# Patient Record
Sex: Female | Born: 1940 | Race: Black or African American | Hispanic: No | Marital: Single | Smoking: Former smoker
Health system: Southern US, Community
[De-identification: ages and names within clinical notes are randomized; demographics above are authoritative.]

## PROBLEM LIST (undated history)

## (undated) DIAGNOSIS — M1990 Unspecified osteoarthritis, unspecified site: Secondary | ICD-10-CM

## (undated) DIAGNOSIS — I10 Essential (primary) hypertension: Secondary | ICD-10-CM

## (undated) DIAGNOSIS — Z992 Dependence on renal dialysis: Secondary | ICD-10-CM

## (undated) DIAGNOSIS — E785 Hyperlipidemia, unspecified: Secondary | ICD-10-CM

## (undated) DIAGNOSIS — N189 Chronic kidney disease, unspecified: Secondary | ICD-10-CM

## (undated) DIAGNOSIS — E119 Type 2 diabetes mellitus without complications: Secondary | ICD-10-CM

## (undated) DIAGNOSIS — H547 Unspecified visual loss: Secondary | ICD-10-CM

## (undated) DIAGNOSIS — H409 Unspecified glaucoma: Secondary | ICD-10-CM

## (undated) HISTORY — DX: Unspecified osteoarthritis, unspecified site: M19.90

## (undated) HISTORY — DX: Dependence on renal dialysis: Z99.2

## (undated) HISTORY — DX: Unspecified glaucoma: H40.9

## (undated) HISTORY — PX: CHOLECYSTECTOMY: SHX55

## (undated) HISTORY — DX: Unspecified visual loss: H54.7

## (undated) HISTORY — DX: Essential (primary) hypertension: I10

## (undated) HISTORY — DX: Hyperlipidemia, unspecified: E78.5

## (undated) HISTORY — DX: Chronic kidney disease, unspecified: N18.9

---

## 2014-01-25 ENCOUNTER — Ambulatory Visit (INDEPENDENT_AMBULATORY_CARE_PROVIDER_SITE_OTHER): Payer: Medicare Other | Admitting: Podiatry

## 2014-01-25 ENCOUNTER — Encounter: Payer: Self-pay | Admitting: Podiatry

## 2014-01-25 VITALS — BP 97/63 | HR 65 | Resp 15 | Ht 67.0 in | Wt 141.0 lb

## 2014-01-25 DIAGNOSIS — E1151 Type 2 diabetes mellitus with diabetic peripheral angiopathy without gangrene: Secondary | ICD-10-CM

## 2014-01-25 DIAGNOSIS — E1140 Type 2 diabetes mellitus with diabetic neuropathy, unspecified: Secondary | ICD-10-CM

## 2014-01-25 DIAGNOSIS — E1149 Type 2 diabetes mellitus with other diabetic neurological complication: Secondary | ICD-10-CM

## 2014-01-25 DIAGNOSIS — B351 Tinea unguium: Secondary | ICD-10-CM

## 2014-01-25 NOTE — Progress Notes (Signed)
   Subjective:    Patient ID: Christina [name], female    DOB: 18-Dec-1940, 73 y.o.   MRN: [id number]  HPI Comments: N debridement L 10 toenails D and O long term C elongated, thick toenails A diabetes, blind, and difficulty cutting T [facility]     Review of Systems  All other systems reviewed and are negative.  end-stage renal disease Visual impairment     Objective:   Physical Exam  Orientated x3 presents with her daughter-in-law  Vascular: DP 1/4 right DP 0/4 left PT 1/4 right PT left 0/4  Neurological: Sensation to 10 g monofilament wire intact 2/5 right and 0/5 left Vibratory sensation nonreactive bilaterally Ankle reflexes reactive bilaterally  Dermatological The toenails are elongated, brittle, hypertrophic 6-10  Musculoskeletal: Unstable gait pattern HAV deformities bilaterally Hammertoe deformities 2-5 bilaterally      Assessment & Plan:   Assessment: Peripheral arterial disease bilaterally Gait disturbance Diabetic peripheral neuropathy Onychomycoses x10  Plan: Nails x10 are debrided without a bleeding  Reappoint at three-month intervals

## 2014-01-25 NOTE — Patient Instructions (Signed)
Diabetes and Foot Care Diabetes may cause you to have problems because of poor blood supply (circulation) to your feet and legs. This may cause the skin on your feet to become thinner, break easier, and heal more slowly. Your skin may become dry, and the skin may peel and crack. You may also have nerve damage in your legs and feet causing decreased feeling in them. You may not notice minor injuries to your feet that could lead to infections or more serious problems. Taking care of your feet is one of the most important things you can do for yourself.  HOME CARE INSTRUCTIONS  Wear shoes at all times, even in the house. Do not go barefoot. Bare feet are easily injured.  Check your feet daily for blisters, cuts, and redness. If you cannot see the bottom of your feet, use a mirror or ask someone for help.  Wash your feet with warm water (do not use hot water) and mild soap. Then pat your feet and the areas between your toes until they are completely dry. Do not soak your feet as this can dry your skin.  Apply a moisturizing lotion or petroleum jelly (that does not contain alcohol and is unscented) to the skin on your feet and to dry, brittle toenails. Do not apply lotion between your toes.  Trim your toenails straight across. Do not dig under them or around the cuticle. File the edges of your nails with an emery board or nail file.  Do not cut corns or calluses or try to remove them with medicine.  Wear clean socks or stockings every day. Make sure they are not too tight. Do not wear knee-high stockings since they may decrease blood flow to your legs.  Wear shoes that fit properly and have enough cushioning. To break in new shoes, wear them for just a few hours a day. This prevents you from injuring your feet. Always look in your shoes before you put them on to be sure there are no objects inside.  Do not cross your legs. This may decrease the blood flow to your feet.  If you find a minor scrape,  cut, or break in the skin on your feet, keep it and the skin around it clean and dry. These areas may be cleansed with mild soap and water. Do not cleanse the area with peroxide, alcohol, or iodine.  When you remove an adhesive bandage, be sure not to damage the skin around it.  If you have a wound, look at it several times a day to make sure it is healing.  Do not use heating pads or hot water bottles. They may burn your skin. If you have lost feeling in your feet or legs, you may not know it is happening until it is too late.  Make sure your health care provider performs a complete foot exam at least annually or more often if you have foot problems. Report any cuts, sores, or bruises to your health care provider immediately. SEEK MEDICAL CARE IF:   You have an injury that is not healing.  You have cuts or breaks in the skin.  You have an ingrown nail.  You notice redness on your legs or feet.  You feel burning or tingling in your legs or feet.  You have pain or cramps in your legs and feet.  Your legs or feet are numb.  Your feet always feel cold. SEEK IMMEDIATE MEDICAL CARE IF:   There is increasing redness,   swelling, or pain in or around a wound.  There is a red line that goes up your leg.  Pus is coming from a wound.  You develop a fever or as directed by your health care provider.  You notice a bad smell coming from an ulcer or wound. Document Released: 04/06/2000 Document Revised: 12/10/2012 Document Reviewed: 09/16/2012 Exit[employer] Patient Information 2015 [employer]. This information is not intended to replace advice given to you by your health care provider. Make sure you discuss any questions you have with your health care provider.  

## 2014-01-26 ENCOUNTER — Encounter: Payer: Self-pay | Admitting: Podiatry

## 2014-03-15 ENCOUNTER — Emergency Department (HOSPITAL_COMMUNITY): Payer: Medicare Other

## 2014-03-15 ENCOUNTER — Encounter (HOSPITAL_COMMUNITY): Payer: Self-pay | Admitting: Emergency Medicine

## 2014-03-15 ENCOUNTER — Emergency Department (HOSPITAL_COMMUNITY)
Admission: EM | Admit: 2014-03-15 | Discharge: 2014-03-15 | Disposition: A | Discharge status: Home / Self Care | Payer: Medicare Other | Attending: Emergency Medicine | Admitting: Emergency Medicine

## 2014-03-15 DIAGNOSIS — Z880 Allergy status to penicillin: Secondary | ICD-10-CM | POA: Diagnosis not present

## 2014-03-15 DIAGNOSIS — E119 Type 2 diabetes mellitus without complications: Secondary | ICD-10-CM | POA: Insufficient documentation

## 2014-03-15 DIAGNOSIS — M150 Primary generalized (osteo)arthritis: Secondary | ICD-10-CM | POA: Diagnosis not present

## 2014-03-15 DIAGNOSIS — M25562 Pain in left knee: Secondary | ICD-10-CM

## 2014-03-15 DIAGNOSIS — Z7982 Long term (current) use of aspirin: Secondary | ICD-10-CM | POA: Diagnosis not present

## 2014-03-15 DIAGNOSIS — M79605 Pain in left leg: Secondary | ICD-10-CM | POA: Diagnosis present

## 2014-03-15 DIAGNOSIS — Z791 Long term (current) use of non-steroidal anti-inflammatories (NSAID): Secondary | ICD-10-CM | POA: Insufficient documentation

## 2014-03-15 DIAGNOSIS — N186 End stage renal disease: Secondary | ICD-10-CM

## 2014-03-15 DIAGNOSIS — H540 Blindness, both eyes: Secondary | ICD-10-CM | POA: Diagnosis not present

## 2014-03-15 DIAGNOSIS — Z992 Dependence on renal dialysis: Secondary | ICD-10-CM | POA: Insufficient documentation

## 2014-03-15 DIAGNOSIS — Z794 Long term (current) use of insulin: Secondary | ICD-10-CM | POA: Insufficient documentation

## 2014-03-15 DIAGNOSIS — E785 Hyperlipidemia, unspecified: Secondary | ICD-10-CM | POA: Insufficient documentation

## 2014-03-15 DIAGNOSIS — R52 Pain, unspecified: Secondary | ICD-10-CM

## 2014-03-15 DIAGNOSIS — I120 Hypertensive chronic kidney disease with stage 5 chronic kidney disease or end stage renal disease: Secondary | ICD-10-CM | POA: Diagnosis not present

## 2014-03-15 DIAGNOSIS — M159 Polyosteoarthritis, unspecified: Secondary | ICD-10-CM

## 2014-03-15 DIAGNOSIS — Z79899 Other long term (current) drug therapy: Secondary | ICD-10-CM | POA: Insufficient documentation

## 2014-03-15 DIAGNOSIS — M25561 Pain in right knee: Secondary | ICD-10-CM

## 2014-03-15 DIAGNOSIS — M25572 Pain in left ankle and joints of left foot: Secondary | ICD-10-CM

## 2014-03-15 DIAGNOSIS — H409 Unspecified glaucoma: Secondary | ICD-10-CM | POA: Diagnosis not present

## 2014-03-15 LAB — COMPREHENSIVE METABOLIC PANEL
ALK PHOS: 110 U/L (ref 39–117)
ALT: 18 U/L (ref 0–35)
AST: 23 U/L (ref 0–37)
Albumin: 3.8 g/dL (ref 3.5–5.2)
Anion gap: 20 — ABNORMAL HIGH (ref 5–15)
BILIRUBIN TOTAL: 0.3 mg/dL (ref 0.3–1.2)
BUN: 55 mg/dL — ABNORMAL HIGH (ref 6–23)
CALCIUM: 9.9 mg/dL (ref 8.4–10.5)
CHLORIDE: 95 meq/L — AB (ref 96–112)
CO2: 22 meq/L (ref 19–32)
Creatinine, Ser: 8.66 mg/dL — ABNORMAL HIGH (ref 0.50–1.10)
GFR calc non Af Amer: 4 mL/min — ABNORMAL LOW (ref >90)
GFR, EST AFRICAN AMERICAN: 5 mL/min — AB (ref >90)
GLUCOSE: 81 mg/dL (ref 70–99)
POTASSIUM: 4.5 meq/L (ref 3.7–5.3)
Sodium: 137 mEq/L (ref 137–147)
Total Protein: 8.3 g/dL (ref 6.0–8.3)

## 2014-03-15 LAB — CBC
HCT: 33.5 % — ABNORMAL LOW (ref 36.0–46.0)
HEMOGLOBIN: 11.5 g/dL — AB (ref 12.0–15.0)
MCH: 33 pg (ref 26.0–34.0)
MCHC: 34.3 g/dL (ref 30.0–36.0)
MCV: 96.3 fL (ref 78.0–100.0)
PLATELETS: 151 10*3/uL (ref 150–400)
RBC: 3.48 MIL/uL — AB (ref 3.87–5.11)
RDW: 13.6 % (ref 11.5–15.5)
WBC: 6.7 10*3/uL (ref 4.0–10.5)

## 2014-03-15 MED ORDER — SODIUM CHLORIDE 0.9 % IV SOLN: INTRAVENOUS | Status: DC

## 2014-03-15 MED ORDER — HYDROCODONE-ACETAMINOPHEN 5-325 MG PO TABS: 1.0000 | ORAL_TABLET | Freq: Four times a day (QID) | ORAL | Status: DC | PRN

## 2014-03-15 MED ORDER — HYDROCODONE-ACETAMINOPHEN 5-325 MG PO TABS
1.0000 | ORAL_TABLET | Freq: Once | ORAL | Status: AC
  Administered 2014-03-15: 1 via ORAL
  Filled 2014-03-15: qty 1

## 2014-03-15 NOTE — ED Provider Notes (Addendum)
CSN: [id number]     Arrival date & time 03/15/14  [id number] History   First MD Initiated Contact with Patient 03/15/14 1854     Chief Complaint  Patient presents with  . Leg Pain     (Consider location/radiation/quality/duration/timing/severity/associated sxs/prior Treatment) Patient is a 73 y.o. female presenting with leg pain. The history is provided by the patient and the EMS personnel.  Leg Pain Associated symptoms: no back pain, no fever and no neck pain   pt with hx dm, esrd on hd, c/o bilateral knee pain and left ankle pain in the past week. States hx same, but worse in past week. States now when tries to stand or walk w walker, that her knees give way, and wont hold her. Denies injury from fall. States at home gets around minimal distances w walker or is in wheelchair. Pt states goes to dialyses T/Th/Sat, went Saturday.  This week her HD schedule was to be changed to M/W/Sat, but she did not go today due to pain in knees and ankle. Denies sob. No chest pain or discomfort. Denies headache. No neck or back pain. No leg swelling. No fever or chills. bil knee and ankle pain described as constant, dull, moderate/severe, worse w standing or trying to walk.     Past Medical History  Diagnosis Date  . Hyperlipidemia   . Hypertension   . Chronic kidney disease   . Glaucoma   . Arthritis   . Dialysis patient   . Diabetes mellitus without complication   . Blind    Past Surgical History  Procedure Laterality Date  . Cholecystectomy     History reviewed. No pertinent family history. History  Substance Use Topics  . Smoking status: Former Games developermoker  . Smokeless tobacco: Not on file  . Alcohol Use: Not on file   OB History    No data available     Review of Systems  Constitutional: Negative for fever and chills.  HENT: Negative for sore throat.   Eyes:       Pt states blind, at baseline.   Respiratory: Negative for cough and shortness of breath.   Cardiovascular: Negative for chest  pain and leg swelling.  Gastrointestinal: Negative for vomiting, abdominal pain and diarrhea.  Genitourinary: Negative for flank pain.  Musculoskeletal: Negative for back pain and neck pain.  Skin: Negative for rash and wound.  Neurological: Negative for weakness, numbness and headaches.  Hematological: Does not bruise/bleed easily.  Psychiatric/Behavioral: Negative for confusion.      Allergies  Cheese; Penicillins; and Tomato  Home Medications   Prior to Admission medications   Medication Sig Start Date End Date Taking? Authorizing Provider  alendronate (FOSAMAX) 70 MG tablet Take 70 mg by mouth once a week. Take with a full glass of water on an empty stomach.    Historical Provider, MD  amLODipine (NORVASC) 5 MG tablet Take 5 mg by mouth daily.    Historical Provider, MD  aspirin 81 MG tablet Take 81 mg by mouth daily.    Historical Provider, MD  atorvastatin (LIPITOR) 20 MG tablet Take 20 mg by mouth daily.    Historical Provider, MD  brimonidine (ALPHAGAN) 0.15 % ophthalmic solution 1 drop 3 (three) times daily.    Historical Provider, MD  Dorzolamide HCl-Timolol Mal PF 22.3-6.8 MG/ML SOLN Apply to eye.    Historical Provider, MD  Ibuprofen (ADVIL) 200 MG CAPS Take by mouth.    Historical Provider, MD  insulin NPH-regular Human (NOVOLIN 70/30) (70-30)  100 UNIT/ML injection Inject into the skin.    Historical Provider, MD  latanoprost (XALATAN) 0.005 % ophthalmic solution 1 drop at bedtime.    Historical Provider, MD  lisinopril (PRINIVIL,ZESTRIL) 20 MG tablet Take 20 mg by mouth daily.    Historical Provider, MD  meloxicam (MOBIC) 15 MG tablet Take 15 mg by mouth daily.    Historical Provider, MD  metoprolol succinate (TOPROL-XL) 100 MG 24 hr tablet Take 100 mg by mouth daily. Take with or immediately following a meal.    Historical Provider, MD  MULTIPLE VITAMIN PO Take by mouth.    Historical Provider, MD  pantoprazole (PROTONIX) 40 MG tablet Take 40 mg by mouth daily.     Historical Provider, MD  traMADol (ULTRAM) 50 MG tablet Take by mouth every 6 (six) hours as needed.    Historical Provider, MD   BP 150/70 mmHg  Pulse 70  Temp(Src) 98 F (36.7 C) (Oral)  Resp 20  Ht 5\' 7"  (1.702 m)  Wt 154 lb 5.2 oz (70 kg)  BMI 24.16 kg/m2  SpO2 100% Physical Exam  Constitutional: She is oriented to person, place, and time. She appears well-developed and well-nourished. No distress.  HENT:  Head: Atraumatic.  Mouth/Throat: Oropharynx is clear and moist.  Eyes: Conjunctivae are normal. No scleral icterus.  Neck: Neck supple. No tracheal deviation present. No thyromegaly present.  No bruit  Cardiovascular: Normal rate, regular rhythm, normal heart sounds and intact distal pulses.   Pulmonary/Chest: Effort normal and breath sounds normal. No respiratory distress.  Abdominal: Soft. Normal appearance and bowel sounds are normal. She exhibits no distension. There is no tenderness.  Genitourinary:  No cva tenderness  Musculoskeletal: She exhibits no edema.  CTLS spine, non tender, aligned, no step off. Tenderness bil knees and left ankle. Knees and ankle grossly stable. Distal pulses palp bil. No effusion or increased warmth at above joints. Good rom bil ext without pain.  No other focal bony tenderness noted. No swelling.   Neurological: She is alert and oriented to person, place, and time.  Motor intact bil. stre 5/5. sens grossly intact.   Skin: Skin is warm and dry. No rash noted. She is not diaphoretic.  Psychiatric: She has a normal mood and affect.  Nursing note and vitals reviewed.   ED Course  Procedures (including critical care time) Labs Review  Results for orders placed or performed during the hospital encounter of 03/15/14  CBC  Result Value Ref Range   WBC 6.7 4.0 - 10.5 K/uL   RBC 3.48 (L) 3.87 - 5.11 MIL/uL   Hemoglobin 11.5 (L) 12.0 - 15.0 g/dL   HCT 16.1 (L) 09.6 - 04.5 %   MCV 96.3 78.0 - 100.0 fL   MCH 33.0 26.0 - 34.0 pg   MCHC 34.3  30.0 - 36.0 g/dL   RDW 40.9 81.1 - 91.4 %   Platelets 151 150 - 400 K/uL  Comprehensive metabolic panel  Result Value Ref Range   Sodium 137 137 - 147 mEq/L   Potassium 4.5 3.7 - 5.3 mEq/L   Chloride 95 (L) 96 - 112 mEq/L   CO2 22 19 - 32 mEq/L   Glucose, Bld 81 70 - 99 mg/dL   BUN 55 (H) 6 - 23 mg/dL   Creatinine, Ser 7.82 (H) 0.50 - 1.10 mg/dL   Calcium 9.9 8.4 - 95.6 mg/dL   Total Protein 8.3 6.0 - 8.3 g/dL   Albumin 3.8 3.5 - 5.2 g/dL   AST 23  0 - 37 U/L   ALT 18 0 - 35 U/L   Alkaline Phosphatase 110 39 - 117 U/L   Total Bilirubin 0.3 0.3 - 1.2 mg/dL   GFR calc non Af Amer 4 (L) >90 mL/min   GFR calc Af Amer 5 (L) >90 mL/min   Anion gap 20 (H) 5 - 15   Dg Chest 2 View  03/15/2014   CLINICAL DATA:  Increasing leg pain and weakness. Left-sided chest pain.  EXAM: CHEST  2 VIEW  COMPARISON:  Chest radiograph 10/09/2013  FINDINGS: The heart size and mediastinal contours are within normal limits. Both lungs are clear. Negative for pneumothorax or pleural effusion. Typical degenerative changes of the thoracic spine.  IMPRESSION: No active cardiopulmonary disease.   Electronically Signed   By: Britta MccreedySusan  Turner M.D.   On: 03/15/2014 20:39   Dg Ankle Complete Left  03/15/2014   CLINICAL DATA:  Left ankle pain following fall 3 days previous  EXAM: LEFT ANKLE COMPLETE - 3+ VIEW  COMPARISON:  None.  FINDINGS: Mild tarsal degenerative changes are seen. Calcaneal spurs are noted. There are multiple well corticated bony densities adjacent to the distal tibia consistent with prior trauma. No other focal abnormality is seen.  IMPRESSION: Changes of prior trauma without acute abnormality.   Electronically Signed   By: Alcide CleverMark  Lukens M.D.   On: 03/15/2014 20:41   Dg Knee Complete 4 Views Left  03/15/2014   CLINICAL DATA:  Increasing left knee pain since a fall 2 days ago.  EXAM: LEFT KNEE - COMPLETE 4+ VIEW  COMPARISON:  09/08/2013  FINDINGS: There is no fracture or dislocation or joint effusion. There  is severe osteoarthritis of the medial compartment with small marginal osteophytes on the patella. There is chondrocalcinosis.  IMPRESSION: No acute abnormality. 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   Electronically Signed   By: Geanie CooleyJim  Maxwell M.D.   On: 03/15/2014 20:39   Dg Knee Complete 4 Views Right  03/15/2014   CLINICAL DATA:  Right knee pain following fall 2 days previous  EXAM: RIGHT KNEE - COMPLETE 4+ VIEW  COMPARISON:  09/08/2013  FINDINGS: Irregular flocculent calcifications are again noted posteriorly likely related to loose bodies. Vascular calcifications are seen. No joint effusion is noted. Severe medial joint space narrowing and osteophytic changes are noted. No acute fracture or dislocation is seen.  IMPRESSION: Degenerative changes and likely loose bodies without acute abnormality.   Electronically Signed   By: Alcide CleverMark  Lukens M.D.   On: 03/15/2014 20:43       EKG Interpretation   Date/Time:  Monday March 15 2014 18:57:59 EST Ventricular Rate:  73 PR Interval:  145 QRS Duration: 96 QT Interval:  410 QTC Calculation: 452 R Axis:   -8 Text Interpretation:  Sinus rhythm Ventricular premature complex No  previous tracing Confirmed by Denton LankSTEINL  MD, Caryn BeeKEVIN ([id number]) on 03/15/2014  7:17:16 PM      MDM   Iv ns. Labs. Imaging.  Reviewed nursing notes and prior charts for additional history.   Hr 142 noted on list of vitals, appears an error, checked on pt then, sr, no tachycardia, hr 70's.   vicodin 1 po.  Recheck pt eating and drinking well. Appears very comfortable. Talking in complete sentences. No sob.   Pt w no joint swelling, redness or increased warmth. No leg swelling. Afeb. Normal passive rom without pain.  Significant degen changes noted on xr.   Pt appears stable for d/c.  k  normal. cxr clear, pulse ox 98%.  As pt missed hd today, will discuss with her renal physician re possible hd tomorrow Estate manager/land agent paged.   Pt remains comfortable, no new  c/o, and pt requests d/c to home stating does not want to stay in hospital.  Family present who can assist pt as need.  Discussed pt with Dr Darrick Penna - he states pt well known to him - to have pt call dialyses center tomorrow morning to get HD tomorrow.       Suzi Roots, MD 03/15/14 [phone]

## 2014-03-15 NOTE — ED Notes (Signed)
Phlebotomy at bedside.

## 2014-03-15 NOTE — ED Notes (Signed)
Pt ready for discharge, waiting on nephrology.

## 2014-03-15 NOTE — Discharge Instructions (Signed)
It was our pleasure to provide your ER care today - we hope that you feel better.  Use great caution to avoid falling and injury - see fall precautions.  Use family assistance and/or walker when up.   You may take hydrocodone as need for pain. Do not take tylenol or acetaminophen containing medication when taking hydrocodone.  Follow up with primary care doctor in coming week.  Regarding your dialyses, we discussed your case with your on call dialyses doctor - he indicates for you to contact your dialyses center first thing tomorrow morning to discuss getting dialyzed tomorrow.  Return to ER if worse, new symptoms, fevers, trouble breathing, other concern.       Osteoarthritis Osteoarthritis is a disease that causes soreness and inflammation of a joint. It occurs when the cartilage at the affected joint wears down. Cartilage acts as a cushion, covering the ends of bones where they meet to form a joint. Osteoarthritis is the most common form of arthritis. It often occurs in older people. The joints affected most often by this condition include those in the:  Ends of the fingers.  Thumbs.  Neck.  Lower back.  Knees.  Hips. CAUSES  Over time, the cartilage that covers the ends of bones begins to wear away. This causes bone to rub on bone, producing pain and stiffness in the affected joints.  RISK FACTORS Certain factors can increase your chances of having osteoarthritis, including:  Older age.  Excessive body weight.  Overuse of joints.  Previous joint injury. SIGNS AND SYMPTOMS   Pain, swelling, and stiffness in the joint.  Over time, the joint may lose its normal shape.  Small deposits of bone (osteophytes) may grow on the edges of the joint.  Bits of bone or cartilage can break off and float inside the joint space. This may cause more pain and damage. DIAGNOSIS  Your health care provider will do a physical exam and ask about your symptoms. Various tests may be  ordered, such as:  X-rays of the affected joint.  An MRI scan.  Blood tests to rule out other types of arthritis.  Joint fluid tests. This involves using a needle to draw fluid from the joint and examining the fluid under a microscope. TREATMENT  Goals of treatment are to control pain and improve joint function. Treatment plans may include:  A prescribed exercise program that allows for rest and joint relief.  A weight control plan.  Pain relief techniques, such as:  Properly applied heat and cold.  Electric pulses delivered to nerve endings under the skin (transcutaneous electrical nerve stimulation [TENS]).  Massage.  Certain nutritional supplements.  Medicines to control pain, such as:  Acetaminophen.  Nonsteroidal anti-inflammatory drugs (NSAIDs), such as naproxen.  Narcotic or central-acting agents, such as tramadol.  Corticosteroids. These can be given orally or as an injection.  Surgery to reposition the bones and relieve pain (osteotomy) or to remove loose pieces of bone and cartilage. Joint replacement may be needed in advanced states of osteoarthritis. HOME CARE INSTRUCTIONS   Take medicines only as directed by your health care provider.  Maintain a healthy weight. Follow your health care provider's instructions for weight control. This may include dietary instructions.  Exercise as directed. Your health care provider can recommend specific types of exercise. These may include:  Strengthening exercises. These are done to strengthen the muscles that support joints affected by arthritis. They can be performed with weights or with exercise bands to add resistance.  Aerobic activities. These are exercises, such as brisk walking or low-impact aerobics, that get your heart pumping.  Range-of-motion activities. These keep your joints limber.  Balance and agility exercises. These help you maintain daily living skills.  Rest your affected joints as directed by  your health care provider.  Keep all follow-up visits as directed by your health care provider. SEEK MEDICAL CARE IF:   Your skin turns red.  You develop a rash in addition to your joint pain.  You have worsening joint pain.  You have a fever along with joint or muscle aches. SEEK IMMEDIATE MEDICAL CARE IF:  You have a significant loss of weight or appetite.  You have night sweats. FOR MORE INFORMATION   National Institute of Arthritis and Musculoskeletal and Skin Diseases: www.niams.http://www.myers.net/  [employer] on Aging: https://walker.com/  American College of Rheumatology: www.rheumatology.org Document Released: 04/09/2005 Document Revised: 08/24/2013 Document Reviewed: 12/15/2012 [facility] Patient Information 2015 [place], [place]. This information is not intended to replace advice given to you by your health care provider. Make sure you discuss any questions you have with your health care provider.     Knee Pain The knee is the complex joint between your thigh and your lower leg. It is made up of bones, tendons, ligaments, and cartilage. The bones that make up the knee are:  The femur in the thigh.  The tibia and fibula in the lower leg.  The patella or kneecap riding in the groove on the lower femur. CAUSES  Knee pain is a common complaint with many causes. A few of these causes are:  Injury, such as:  A ruptured ligament or tendon injury.  Torn cartilage.  Medical conditions, such as:  Gout  Arthritis  Infections  Overuse, over training, or overdoing a physical activity. Knee pain can be minor or severe. Knee pain can accompany debilitating injury. Minor knee problems often respond well to self-care measures or get well on their own. More serious injuries may need medical intervention or even surgery. SYMPTOMS The knee is complex. Symptoms of knee problems can vary widely. Some of the problems are:  Pain with movement and weight bearing.  Swelling and  tenderness.  Buckling of the knee.  Inability to straighten or extend your knee.  Your knee locks and you cannot straighten it.  Warmth and redness with pain and fever.  Deformity or dislocation of the kneecap. DIAGNOSIS  Determining what is wrong may be very straight forward such as when there is an injury. It can also be challenging because of the complexity of the knee. Tests to make a diagnosis may include:  Your caregiver taking a history and doing a physical exam.  Routine X-rays can be used to rule out other problems. X-rays will not reveal a cartilage tear. Some injuries of the knee can be diagnosed by:  Arthroscopy a surgical technique by which a small video camera is inserted through tiny incisions on the sides of the knee. This procedure is used to examine and repair internal knee joint problems. Tiny instruments can be used during arthroscopy to repair the torn knee cartilage (meniscus).  Arthrography is a radiology technique. A contrast liquid is directly injected into the knee joint. Internal structures of the knee joint then become visible on X-ray film.  An MRI scan is a non X-ray radiology procedure in which magnetic fields and a computer produce two- or three-dimensional images of the inside of the knee. Cartilage tears are often visible using an MRI scanner. MRI  scans have largely replaced arthrography in diagnosing cartilage tears of the knee.  Blood work.  Examination of the fluid that helps to lubricate the knee joint (synovial fluid). This is done by taking a sample out using a needle and a syringe. TREATMENT The treatment of knee problems depends on the cause. Some of these treatments are:  Depending on the injury, proper casting, splinting, surgery, or physical therapy care will be needed.  Give yourself adequate recovery time. Do not overuse your joints. If you begin to get sore during workout routines, back off. Slow down or do fewer repetitions.  For  repetitive activities such as cycling or running, maintain your strength and nutrition.  Alternate muscle groups. For example, if you are a weight lifter, work the upper body on one day and the lower body the next.  Either tight or weak muscles do not give the proper support for your knee. Tight or weak muscles do not absorb the stress placed on the knee joint. Keep the muscles surrounding the knee strong.  Take care of mechanical problems.  If you have flat feet, orthotics or special shoes may help. See your caregiver if you need help.  Arch supports, sometimes with wedges on the inner or outer aspect of the heel, can help. These can shift pressure away from the side of the knee most bothered by osteoarthritis.  A brace called an "unloader" brace also may be used to help ease the pressure on the most arthritic side of the knee.  If your caregiver has prescribed crutches, braces, wraps or ice, use as directed. The acronym for this is PRICE. This means protection, rest, ice, compression, and elevation.  Nonsteroidal anti-inflammatory drugs (NSAIDs), can help relieve pain. But if taken immediately after an injury, they may actually increase swelling. Take NSAIDs with food in your stomach. Stop them if you develop stomach problems. Do not take these if you have a history of ulcers, stomach pain, or bleeding from the bowel. Do not take without your caregiver's approval if you have problems with fluid retention, heart failure, or kidney problems.  For ongoing knee problems, physical therapy may be helpful.  Glucosamine and chondroitin are over-the-counter dietary supplements. Both may help relieve the pain of osteoarthritis in the knee. These medicines are different from the usual anti-inflammatory drugs. Glucosamine may decrease the rate of cartilage destruction.  Injections of a corticosteroid drug into your knee joint may help reduce the symptoms of an arthritis flare-up. They may provide pain  relief that lasts a few months. You may have to wait a few months between injections. The injections do have a small increased risk of infection, water retention, and elevated blood sugar levels.  Hyaluronic acid injected into damaged joints may ease pain and provide lubrication. These injections may work by reducing inflammation. A series of shots may give relief for as long as 6 months.  Topical painkillers. Applying certain ointments to your skin may help relieve the pain and stiffness of osteoarthritis. Ask your pharmacist for suggestions. Many over the-counter products are approved for temporary relief of arthritis pain.  In some countries, doctors often prescribe topical NSAIDs for relief of chronic conditions such as arthritis and tendinitis. A review of treatment with NSAID creams found that they worked as well as oral medications but without the serious side effects. PREVENTION  Maintain a healthy weight. Extra pounds put more strain on your joints.  Get strong, stay limber. Weak muscles are a common cause of knee injuries. Stretching  is important. Include flexibility exercises in your workouts.  Be smart about exercise. If you have osteoarthritis, chronic knee pain or recurring injuries, you may need to change the way you exercise. This does not mean you have to stop being active. If your knees ache after jogging or playing basketball, consider switching to swimming, water aerobics, or other low-impact activities, at least for a few days a week. Sometimes limiting high-impact activities will provide relief.  Make sure your shoes fit well. Choose footwear that is right for your sport.  Protect your knees. Use the proper gear for knee-sensitive activities. Use kneepads when playing volleyball or laying carpet. Buckle your seat belt every time you drive. Most shattered kneecaps occur in car accidents.  Rest when you are tired. SEEK MEDICAL CARE IF:  You have knee pain that is continual  and does not seem to be getting better.  SEEK IMMEDIATE MEDICAL CARE IF:  Your knee joint feels hot to the touch and you have a high fever. MAKE SURE YOU:   Understand these instructions.  Will watch your condition.  Will get help right away if you are not doing well or get worse. Document Released: 02/04/2007 Document Revised: 07/02/2011 Document Reviewed: 02/04/2007 [facility] Patient Information 2015 [place], [place]. This information is not intended to replace advice given to you by your health care provider. Make sure you discuss any questions you have with your health care provider.    Fall Prevention and Home Safety Falls cause injuries and can affect all age groups. It is possible to use preventive measures to significantly decrease the likelihood of falls. There are many simple measures which can make your home safer and prevent falls. OUTDOORS  Repair cracks and edges of walkways and driveways.  Remove high doorway thresholds.  Trim shrubbery on the main path into your home.  Have good outside lighting.  Clear walkways of tools, rocks, debris, and clutter.  Check that handrails are not broken and are securely fastened. Both sides of steps should have handrails.  Have leaves, snow, and ice cleared regularly.  Use sand or salt on walkways during winter months.  In the garage, clean up grease or oil spills. BATHROOM  Install night lights.  Install grab bars by the toilet and in the tub and shower.  Use non-skid mats or decals in the tub or shower.  Place a plastic non-slip stool in the shower to sit on, if needed.  Keep floors dry and clean up all water on the floor immediately.  Remove soap buildup in the tub or shower on a regular basis.  Secure bath mats with non-slip, double-sided rug tape.  Remove throw rugs and tripping hazards from the floors. BEDROOMS  Install night lights.  Make sure a bedside light is easy to reach.  Do not use oversized  bedding.  Keep a telephone by your bedside.  Have a firm chair with side arms to use for getting dressed.  Remove throw rugs and tripping hazards from the floor. KITCHEN  Keep handles on pots and pans turned toward the center of the stove. Use back burners when possible.  Clean up spills quickly and allow time for drying.  Avoid walking on wet floors.  Avoid hot utensils and knives.  Position shelves so they are not too high or low.  Place commonly used objects within easy reach.  If necessary, use a sturdy step stool with a grab bar when reaching.  Keep electrical cables out of the way.  Do  not use floor polish or wax that makes floors slippery. If you must use wax, use non-skid floor wax.  Remove throw rugs and tripping hazards from the floor. STAIRWAYS  Never leave objects on stairs.  Place handrails on both sides of stairways and use them. Fix any loose handrails. Make sure handrails on both sides of the stairways are as long as the stairs.  Check carpeting to make sure it is firmly attached along stairs. Make repairs to worn or loose carpet promptly.  Avoid placing throw rugs at the top or bottom of stairways, or properly secure the rug with carpet tape to prevent slippage. Get rid of throw rugs, if possible.  Have an electrician put in a light switch at the top and bottom of the stairs. OTHER FALL PREVENTION TIPS  Wear low-heel or rubber-soled shoes that are supportive and fit well. Wear closed toe shoes.  When using a stepladder, make sure it is fully opened and both spreaders are firmly locked. Do not climb a closed stepladder.  Add color or contrast paint or tape to grab bars and handrails in your home. Place contrasting color strips on first and last steps.  Learn and use mobility aids as needed. Install an electrical emergency response system.  Turn on lights to avoid dark areas. Replace light bulbs that burn out immediately. Get light switches that  glow.  Arrange furniture to create clear pathways. Keep furniture in the same place.  Firmly attach carpet with non-skid or double-sided tape.  Eliminate uneven floor surfaces.  Select a carpet pattern that does not visually hide the edge of steps.  Be aware of all pets. OTHER HOME SAFETY TIPS  Set the water temperature for 120 F (48.8 C).  Keep emergency numbers on or near the telephone.  Keep smoke detectors on every level of the home and near sleeping areas. Document Released: 03/30/2002 Document Revised: 10/09/2011 Document Reviewed: 06/29/2011 [facility] Patient Information 2015 [place], [place]. This information is not intended to replace advice given to you by your health care provider. Make sure you discuss any questions you have with your health care provider.    Dialysis Dialysis is a procedure that replaces some of the work healthy kidneys do. It is done when you lose about 85-90% of your kidney function. It may also be done earlier if your symptoms may be improved by dialysis. During dialysis, wastes, salt, and extra water are removed from the blood, and the levels of certain chemicals in the blood (such as potassium) are maintained. Dialysis is done in sessions. Dialysis sessions are continued until the kidneys get better. If the kidneys cannot get better, such as in end-stage kidney disease, dialysis is continued for life or until you receive a new kidney (kidney transplant). There are two types of dialysis: hemodialysis and peritoneal dialysis. WHAT IS HEMODIALYSIS?  Hemodialysis is a type of dialysis in which a machine called a dialyzer is used to filter the blood. Before beginning hemodialysis, you will have surgery to create a site where blood can be removed from the body and returned to the body (vascular access). There are three types of vascular accesses:  Arteriovenous fistula. To create this type of access, an artery is connected to a vein (usually in the arm). A  fistula takes 1-6 months to develop after surgery. If it develops properly, it usually lasts longer than the other types of vascular accesses. It is also less likely to become infected and cause blood clots.  Arteriovenous graft. To  create this type of access, an artery and a vein in the arm are connected with a tube. A graft may be used within 2-3 weeks of surgery.  A venous catheter. To create this type of access, a thin, flexible tube (catheter) is placed in a large vein in your neck, chest, or groin. A catheter may be used right away. It is usually used as a temporary access when dialysis needs to begin immediately. During hemodialysis, blood leaves the body through your access. It travels through a tube to the dialyzer, where it is filtered. The blood then returns to your body through another tube. Hemodialysis is usually performed by a health care provider at a hospital or dialysis center three times a week. Visits last about 3-4 hours. It may also be performed with the help of another person at home with training.  WHAT IS PERITONEAL DIALYSIS? Peritoneal dialysis is a type of dialysis in which the thin lining of the abdomen (peritoneum) is used as a filter. Before beginning peritoneal dialysis, you will have surgery to place a catheter in your abdomen. The catheter will be used to transfer a fluid called dialysate to and from your abdomen. At the start of a session, your abdomen is filled with dialysate. During the session, wastes, salt, and extra water in the blood pass through the peritoneum and into the dialysate. The dialysate is drained from the body at the end of the session. The process of filling and draining the dialysate is called an exchange. Exchanges are repeated until you have used up all the dialysate for the day. Peritoneal dialysis may be performed by you at home or at almost any other location. It is done every day. You may need up to five exchanges a day. The amount of time the  dialysate is in your body between exchanges is called a dwell. The dwell depends on the number of exchanges needed and the characteristics of the peritoneum. It usually varies from 1.5-3 hours. You may go about your day normally between exchanges. Alternately, the exchanges may be done at night while you sleep, using a machine called a cycler. WHICH TYPE OF DIALYSIS SHOULD I CHOOSE?  Both hemodialysis and peritoneal dialysis have advantages and disadvantages. Talk to your health care provider about which type of dialysis would be best for you. Your lifestyle and preferences should be considered along with your medical condition. In some cases, only one type of dialysis may be an option.  Advantages of hemodialysis  It is done less often than peritoneal dialysis.  Someone else can do the dialysis for you.  If you go to a dialysis center, your health care provider will be able to recognize any problems right away.  If you go to a dialysis center, you can interact with others who are having dialysis. This can provide you with emotional support. Disadvantages of hemodialysis  Hemodialysis may cause cramps and low blood pressure. It may leave you feeling tired on the days you have the treatment.  If you go to a dialysis center, you will need to make weekly appointments and work around the center's schedule.  You will need to take extra care when traveling. If you go to a dialysis center, you will need to make special arrangements to visit a dialysis center near your destination. If you are having treatments at home, you will need to take the dialyzer with you to your destination.  You will need to avoid more foods than you would need to  avoid on peritoneal dialysis. Advantages of peritoneal dialysis  It is less likely than hemodialysis to cause cramps and low blood pressure.  You may do exchanges on your own wherever you are, including when you travel.  You do not need to avoid as many foods as  you do on hemodialysis. Disadvantages of peritoneal dialysis  It is done more often than hemodialysis.  Performing peritoneal dialysis requires you to have dexterity of the hands. You must also be able to lift bags.  You will have to learn sterilization techniques. You will need to practice them every day to reduce the risk of infection. WHAT CHANGES WILL I NEED TO MAKE TO MY DIET DURING DIALYSIS? Both hemodialysis and peritoneal dialysis require you to make some changes to your diet. For example, you will need to limit your intake of foods high in the minerals phosphorus and potassium. You will also need to limit your fluid intake. Your dietitian can help you plan meals. A good meal plan can improve your dialysis and your health.  WHAT SHOULD I EXPECT WHEN BEGINNING DIALYSIS? Adjusting to the dialysis treatment, schedule, and diet can take some time. You may need to stop working and may not be able to do some of the things you normally do. You may feel anxious or depressed when beginning dialysis. Eventually, many people feel better overall because of dialysis. Some people are able to return to work after making some changes, such as reducing work intensity. WHERE CAN I FIND MORE INFORMATION?   National Kidney Foundation: www.kidney.org  American Association of Kidney Patients: ResidentialShow.is  American Kidney Fund: www.kidneyfund.org Document Released: 06/30/2002 Document Revised: 08/24/2013 Document Reviewed: 06/03/2012 [facility] Patient Information 2015 [place], [place]. This information is not intended to replace advice given to you by your health care provider. Make sure you discuss any questions you have with your health care provider.    Dialysis Diet A dialysis diet is a special diet when you start peritoneal dialysis or hemodialysis. Foods must be chosen carefully because different foods produce different wastes in your blood. If you are on dialysis treatment, that means your kidneys  have stopped working properly on their own. This means the kidneys are removing very little or no wastes from your blood. Dialysis can perform the function of a healthy kidney and filter wastes from your body. However, between dialysis sessions, wastes build up in your blood and can make you sick. You can reduce the amount of wastes that build up in your blood by watching what you eat and drink. A good meal plan can improve your dialysis and your health. Your dietitian or renal dietitian can help you plan meals.  FLUIDS In between dialysis sessions, your kidneys may be able to remove some fluid or none at all. Fluid can build up and cause swelling and weight gain. The extra fluid affects your blood pressure and can make your heart work harder. Overloading your body with fluid could lead to serious heart trouble. Every person on dialysis has a different amount of fluid that they can have each day. Talk to your dietitian about how much fluid you can have each day and write that down.  Foods that add to your fluid intake include:  Foods that contain liquid at room temperature, such as soup, gelatin dessert, and ice cream.  Fruits and vegetables, such as melons, grapes, apples, oranges, tomatoes, lettuce, and celery. Your dietitian will be able to give you other tips for managing your thirst. POTASSIUM Potassium is  a mineral found in many foods, especially milk, fruits, and vegetables. It affects how steadily your heart beats. Potassium levels can rise between dialysis sessions and can affect your heartbeat and may even cause death.  Avoid foods high in potassium. If you do eat high-potassium foods, eat smaller portions. For example, eat half a pear instead of a whole pear. Eat only very small portions of oranges and melons. You can remove some of the potassium from potatoes and other vegetables by peeling them and then soaking them in a large amount of water for several hours. Drain and rinse them before  cooking. Talk to a dietitian about foods you can eat instead of high-potassium foods. High-potassium foods and drinks include:  Apricots.  Brussels sprouts.  Dates.  Lima beans.  Oranges.  Prune juice.  Spinach.  Avocados.  Milk.  Figs.  Melons.  Peanuts.  Prunes.  Tomatoes.  Bananas.  Cantaloupe.  Kiwi fruit.  Nectarines.  Asparagus spears.  Raisins.  Winter squash.  Beets.  Clams.  Orange juice.  Potatoes.  Sardines.  Yogurt. PHOSPHORUS Phosphorus is a mineral found in many foods. If you have too much phosphorus in your blood, your bones lose calcium. Losing calcium will make your bones weak and more likely to break. Also, too much phosphorus may make your skin itch. Food high in phosphorus include milk, cheese, dried beans, peas, colas, nuts, and peanut butter. Avoid eating too much of these foods. Usually, people on dialysis are limited to  cup of milk per day. Talk to a dietitian about foods you can eat instead of high-phosphorus foods. PROTEIN You may be encouraged to eat as much "high-quality protein" as you can. High-quality proteins come from meat, fish, poultry, and eggs. Choose low-fat (lean) meats that are also low in phosphorus. If you are a vegetarian, ask your dietitian about other ways to get your protein. Low-fat milk is a good source of protein, but milk is high in phosphorus and potassium and adds to your fluid intake. Talk to a dietitian to see if milk fits into your food plan. SODIUM Sodium makes you thirsty, and if you are on dialysis, you must restrict how much fluid you drink. Therefore, try to eat fresh foods that are naturally low in sodium. Stay away from canned foods and frozen dinners. Look for products labeled "low sodium." Do not use salt substitutes because they contain potassium. Talk to your dietitian about foods and spice blends without sodium or potassium.  CALORIES Calories come from food and provide energy for your  body. Your dietitian may recommend adding or cutting down on the calories you eat depending on if you need to lose or gain weight. Talk to a dietitian about foods you can eat to either gain or lose weight. VITAMINS AND MINERALS Your caregiver may prescribe a vitamin and mineral supplement. Vitamins and minerals may be missing from your diet because you have to avoid so many foods. Talk to your dietitian or kidney doctor before choosing a supplement. Many vitamin or mineral supplements sold on the store shelf may be harmful to you. Document Released: 01/05/2004 Document Revised: 10/09/2011 Document Reviewed: 06/19/2011 [facility] Patient Information 2015 [place], [place]. This information is not intended to replace advice given to you by your health care provider. Make sure you discuss any questions you have with your health care provider.

## 2014-03-15 NOTE — ED Notes (Signed)
Dr. Steinl at bedside 

## 2014-03-15 NOTE — ED Notes (Signed)
Pt returned from xray

## 2014-03-15 NOTE — ED Notes (Signed)
Per EMS, pt comes from home with c/o increasing leg pain and weakness since Saturday. Pt A&OX4, NAD noted. Pt denies chest pain or SOB. Pt uses a walker and wheelchair for generalized weakness. Pt has h/o DM, kidney failure, and is legally blind. VSS.

## 2014-03-15 NOTE — ED Notes (Signed)
Patient transported to X-ray 

## 2014-05-10 ENCOUNTER — Ambulatory Visit (INDEPENDENT_AMBULATORY_CARE_PROVIDER_SITE_OTHER): Payer: Medicare Other | Admitting: Podiatry

## 2014-05-10 DIAGNOSIS — B351 Tinea unguium: Secondary | ICD-10-CM

## 2014-05-10 DIAGNOSIS — E1140 Type 2 diabetes mellitus with diabetic neuropathy, unspecified: Secondary | ICD-10-CM

## 2014-05-10 DIAGNOSIS — E1151 Type 2 diabetes mellitus with diabetic peripheral angiopathy without gangrene: Secondary | ICD-10-CM

## 2014-05-10 DIAGNOSIS — E1149 Type 2 diabetes mellitus with other diabetic neurological complication: Secondary | ICD-10-CM

## 2014-05-10 NOTE — Patient Instructions (Signed)
Diabetes and Foot Care Diabetes may cause you to have problems because of poor blood supply (circulation) to your feet and legs. This may cause the skin on your feet to become thinner, break easier, and heal more slowly. Your skin may become dry, and the skin may peel and crack. You may also have nerve damage in your legs and feet causing decreased feeling in them. You may not notice minor injuries to your feet that could lead to infections or more serious problems. Taking care of your feet is one of the most important things you can do for yourself.  HOME CARE INSTRUCTIONS  Wear shoes at all times, even in the house. Do not go barefoot. Bare feet are easily injured.  Check your feet daily for blisters, cuts, and redness. If you cannot see the bottom of your feet, use a mirror or ask someone for help.  Wash your feet with warm water (do not use hot water) and mild soap. Then pat your feet and the areas between your toes until they are completely dry. Do not soak your feet as this can dry your skin.  Apply a moisturizing lotion or petroleum jelly (that does not contain alcohol and is unscented) to the skin on your feet and to dry, brittle toenails. Do not apply lotion between your toes.  Trim your toenails straight across. Do not dig under them or around the cuticle. File the edges of your nails with an emery board or nail file.  Do not cut corns or calluses or try to remove them with medicine.  Wear clean socks or stockings every day. Make sure they are not too tight. Do not wear knee-high stockings since they may decrease blood flow to your legs.  Wear shoes that fit properly and have enough cushioning. To break in new shoes, wear them for just a few hours a day. This prevents you from injuring your feet. Always look in your shoes before you put them on to be sure there are no objects inside.  Do not cross your legs. This may decrease the blood flow to your feet.  If you find a minor scrape,  cut, or break in the skin on your feet, keep it and the skin around it clean and dry. These areas may be cleansed with mild soap and water. Do not cleanse the area with peroxide, alcohol, or iodine.  When you remove an adhesive bandage, be sure not to damage the skin around it.  If you have a wound, look at it several times a day to make sure it is healing.  Do not use heating pads or hot water bottles. They may burn your skin. If you have lost feeling in your feet or legs, you may not know it is happening until it is too late.  Make sure your health care provider performs a complete foot exam at least annually or more often if you have foot problems. Report any cuts, sores, or bruises to your health care provider immediately. SEEK MEDICAL CARE IF:   You have an injury that is not healing.  You have cuts or breaks in the skin.  You have an ingrown nail.  You notice redness on your legs or feet.  You feel burning or tingling in your legs or feet.  You have pain or cramps in your legs and feet.  Your legs or feet are numb.  Your feet always feel cold. SEEK IMMEDIATE MEDICAL CARE IF:   There is increasing redness,   swelling, or pain in or around a wound.  There is a red line that goes up your leg.  Pus is coming from a wound.  You develop a fever or as directed by your health care provider.  You notice a bad smell coming from an ulcer or wound. Document Released: 04/06/2000 Document Revised: 12/10/2012 Document Reviewed: 09/16/2012 Exit[employer] Patient Information 2015 [employer]. This information is not intended to replace advice given to you by your health care provider. Make sure you discuss any questions you have with your health care provider.  

## 2014-05-10 NOTE — Progress Notes (Signed)
Patient ID: Christina [name], female   DOB: 1941/03/12, 74 y.o.   MRN: [id number]  Subjective: Visually impaired and stage renal disease patient presents requesting nail debridement  Objective: The toenails are brittle, hypertrophic elongated 6-10  Assessment: End-stage renal disease History of peripheral to disease bilaterally History of diabetic peripheral neuropathy Onychomycoses 6-10  Plan: Debridement of toenails 10 without a bleeding  Reappoint 3 months

## 2014-05-18 ENCOUNTER — Inpatient Hospital Stay (HOSPITAL_COMMUNITY): Payer: Medicare Other

## 2014-05-18 ENCOUNTER — Emergency Department (HOSPITAL_COMMUNITY): Payer: Medicare Other

## 2014-05-18 ENCOUNTER — Inpatient Hospital Stay (HOSPITAL_COMMUNITY)
Admission: EM | Admit: 2014-05-18 | Discharge: 2014-05-21 | DRG: 682 | Disposition: A | Discharge status: Home / Self Care | Payer: Medicare Other | Attending: Internal Medicine | Admitting: Internal Medicine

## 2014-05-18 ENCOUNTER — Encounter (HOSPITAL_COMMUNITY): Payer: Self-pay

## 2014-05-18 DIAGNOSIS — A047 Enterocolitis due to Clostridium difficile: Secondary | ICD-10-CM | POA: Diagnosis present

## 2014-05-18 DIAGNOSIS — Z794 Long term (current) use of insulin: Secondary | ICD-10-CM | POA: Diagnosis not present

## 2014-05-18 DIAGNOSIS — N186 End stage renal disease: Secondary | ICD-10-CM | POA: Diagnosis present

## 2014-05-18 DIAGNOSIS — I120 Hypertensive chronic kidney disease with stage 5 chronic kidney disease or end stage renal disease: Principal | ICD-10-CM | POA: Diagnosis present

## 2014-05-18 DIAGNOSIS — Z91018 Allergy to other foods: Secondary | ICD-10-CM | POA: Diagnosis not present

## 2014-05-18 DIAGNOSIS — M1990 Unspecified osteoarthritis, unspecified site: Secondary | ICD-10-CM | POA: Diagnosis present

## 2014-05-18 DIAGNOSIS — Z993 Dependence on wheelchair: Secondary | ICD-10-CM

## 2014-05-18 DIAGNOSIS — I639 Cerebral infarction, unspecified: Secondary | ICD-10-CM | POA: Diagnosis present

## 2014-05-18 DIAGNOSIS — N179 Acute kidney failure, unspecified: Secondary | ICD-10-CM | POA: Diagnosis present

## 2014-05-18 DIAGNOSIS — E11649 Type 2 diabetes mellitus with hypoglycemia without coma: Secondary | ICD-10-CM | POA: Diagnosis present

## 2014-05-18 DIAGNOSIS — Z87891 Personal history of nicotine dependence: Secondary | ICD-10-CM

## 2014-05-18 DIAGNOSIS — H548 Legal blindness, as defined in USA: Secondary | ICD-10-CM | POA: Diagnosis present

## 2014-05-18 DIAGNOSIS — I10 Essential (primary) hypertension: Secondary | ICD-10-CM | POA: Diagnosis present

## 2014-05-18 DIAGNOSIS — Z9049 Acquired absence of other specified parts of digestive tract: Secondary | ICD-10-CM | POA: Diagnosis present

## 2014-05-18 DIAGNOSIS — I739 Peripheral vascular disease, unspecified: Secondary | ICD-10-CM | POA: Diagnosis present

## 2014-05-18 DIAGNOSIS — A0472 Enterocolitis due to Clostridium difficile, not specified as recurrent: Secondary | ICD-10-CM

## 2014-05-18 DIAGNOSIS — K219 Gastro-esophageal reflux disease without esophagitis: Secondary | ICD-10-CM | POA: Diagnosis present

## 2014-05-18 DIAGNOSIS — E1129 Type 2 diabetes mellitus with other diabetic kidney complication: Secondary | ICD-10-CM | POA: Diagnosis present

## 2014-05-18 DIAGNOSIS — E162 Hypoglycemia, unspecified: Secondary | ICD-10-CM

## 2014-05-18 DIAGNOSIS — J189 Pneumonia, unspecified organism: Secondary | ICD-10-CM

## 2014-05-18 DIAGNOSIS — E119 Type 2 diabetes mellitus without complications: Secondary | ICD-10-CM

## 2014-05-18 DIAGNOSIS — D649 Anemia, unspecified: Secondary | ICD-10-CM | POA: Diagnosis present

## 2014-05-18 DIAGNOSIS — R4182 Altered mental status, unspecified: Secondary | ICD-10-CM | POA: Insufficient documentation

## 2014-05-18 DIAGNOSIS — Z880 Allergy status to penicillin: Secondary | ICD-10-CM | POA: Diagnosis not present

## 2014-05-18 DIAGNOSIS — I6529 Occlusion and stenosis of unspecified carotid artery: Secondary | ICD-10-CM | POA: Diagnosis present

## 2014-05-18 DIAGNOSIS — H409 Unspecified glaucoma: Secondary | ICD-10-CM | POA: Diagnosis present

## 2014-05-18 DIAGNOSIS — E785 Hyperlipidemia, unspecified: Secondary | ICD-10-CM | POA: Diagnosis present

## 2014-05-18 DIAGNOSIS — G9341 Metabolic encephalopathy: Secondary | ICD-10-CM | POA: Diagnosis present

## 2014-05-18 DIAGNOSIS — Z992 Dependence on renal dialysis: Secondary | ICD-10-CM | POA: Diagnosis not present

## 2014-05-18 DIAGNOSIS — Z91011 Allergy to milk products: Secondary | ICD-10-CM

## 2014-05-18 DIAGNOSIS — G9340 Encephalopathy, unspecified: Secondary | ICD-10-CM

## 2014-05-18 DIAGNOSIS — E1165 Type 2 diabetes mellitus with hyperglycemia: Secondary | ICD-10-CM | POA: Diagnosis present

## 2014-05-18 DIAGNOSIS — R410 Disorientation, unspecified: Secondary | ICD-10-CM

## 2014-05-18 DIAGNOSIS — Z7982 Long term (current) use of aspirin: Secondary | ICD-10-CM | POA: Diagnosis not present

## 2014-05-18 LAB — HEPATITIS B SURFACE ANTIGEN: Hepatitis B Surface Ag: NEGATIVE

## 2014-05-18 LAB — HEMOGLOBIN A1C
HEMOGLOBIN A1C: 7.5 % — AB (ref <5.7)
Mean Plasma Glucose: 169 mg/dL — ABNORMAL HIGH (ref <117)

## 2014-05-18 LAB — GLUCOSE, CAPILLARY
GLUCOSE-CAPILLARY: 82 mg/dL (ref 70–99)
Glucose-Capillary: 73 mg/dL (ref 70–99)

## 2014-05-18 LAB — COMPREHENSIVE METABOLIC PANEL
ALT: 16 U/L (ref 0–35)
AST: 23 U/L (ref 0–37)
Albumin: 3.4 g/dL — ABNORMAL LOW (ref 3.5–5.2)
Alkaline Phosphatase: 72 U/L (ref 39–117)
Anion gap: 16 — ABNORMAL HIGH (ref 5–15)
BILIRUBIN TOTAL: 0.6 mg/dL (ref 0.3–1.2)
BUN: 84 mg/dL — AB (ref 6–23)
CO2: 22 mmol/L (ref 19–32)
Calcium: 8.8 mg/dL (ref 8.4–10.5)
Chloride: 104 mmol/L (ref 96–112)
Creatinine, Ser: 11.23 mg/dL — ABNORMAL HIGH (ref 0.50–1.10)
GFR calc Af Amer: 3 mL/min — ABNORMAL LOW (ref >90)
GFR calc non Af Amer: 3 mL/min — ABNORMAL LOW (ref >90)
GLUCOSE: 55 mg/dL — AB (ref 70–99)
Potassium: 4.8 mmol/L (ref 3.5–5.1)
SODIUM: 142 mmol/L (ref 135–145)
TOTAL PROTEIN: 7.6 g/dL (ref 6.0–8.3)

## 2014-05-18 LAB — CBC
HCT: 32.4 % — ABNORMAL LOW (ref 36.0–46.0)
Hemoglobin: 11.2 g/dL — ABNORMAL LOW (ref 12.0–15.0)
MCH: 33.1 pg (ref 26.0–34.0)
MCHC: 34.6 g/dL (ref 30.0–36.0)
MCV: 95.9 fL (ref 78.0–100.0)
PLATELETS: 149 10*3/uL — AB (ref 150–400)
RBC: 3.38 MIL/uL — AB (ref 3.87–5.11)
RDW: 12.7 % (ref 11.5–15.5)
WBC: 4.8 10*3/uL (ref 4.0–10.5)

## 2014-05-18 LAB — CBG MONITORING, ED
GLUCOSE-CAPILLARY: 44 mg/dL — AB (ref 70–99)
Glucose-Capillary: 192 mg/dL — ABNORMAL HIGH (ref 70–99)
Glucose-Capillary: 173 mg/dL — ABNORMAL HIGH (ref 70–99)

## 2014-05-18 LAB — RENAL FUNCTION PANEL
ALBUMIN: 3.3 g/dL — AB (ref 3.5–5.2)
Anion gap: 15 (ref 5–15)
BUN: 83 mg/dL — ABNORMAL HIGH (ref 6–23)
CO2: 20 mmol/L (ref 19–32)
Calcium: 8.6 mg/dL (ref 8.4–10.5)
Chloride: 105 mmol/L (ref 96–112)
Creatinine, Ser: 11.57 mg/dL — ABNORMAL HIGH (ref 0.50–1.10)
GFR calc non Af Amer: 3 mL/min — ABNORMAL LOW (ref >90)
GFR, EST AFRICAN AMERICAN: 3 mL/min — AB (ref >90)
GLUCOSE: 118 mg/dL — AB (ref 70–99)
PHOSPHORUS: 6.2 mg/dL — AB (ref 2.3–4.6)
POTASSIUM: 5.5 mmol/L — AB (ref 3.5–5.1)
SODIUM: 140 mmol/L (ref 135–145)

## 2014-05-18 LAB — TROPONIN I: Troponin I: <0.03 ng/mL (ref <0.031)

## 2014-05-18 LAB — CBC WITH DIFFERENTIAL/PLATELET
BASOS ABS: 0 10*3/uL (ref 0.0–0.1)
BASOS PCT: 0 % (ref 0–1)
EOS ABS: 0.1 10*3/uL (ref 0.0–0.7)
Eosinophils Relative: 2 % (ref 0–5)
HEMATOCRIT: 35.7 % — AB (ref 36.0–46.0)
HEMOGLOBIN: 12.2 g/dL (ref 12.0–15.0)
Lymphocytes Relative: 48 % — ABNORMAL HIGH (ref 12–46)
Lymphs Abs: 2.8 10*3/uL (ref 0.7–4.0)
MCH: 33.2 pg (ref 26.0–34.0)
MCHC: 34.2 g/dL (ref 30.0–36.0)
MCV: 97.3 fL (ref 78.0–100.0)
MONO ABS: 0.5 10*3/uL (ref 0.1–1.0)
Monocytes Relative: 9 % (ref 3–12)
Neutro Abs: 2.4 10*3/uL (ref 1.7–7.7)
Neutrophils Relative %: 41 % — ABNORMAL LOW (ref 43–77)
Platelets: 158 10*3/uL (ref 150–400)
RBC: 3.67 MIL/uL — AB (ref 3.87–5.11)
RDW: 12.6 % (ref 11.5–15.5)
WBC: 5.9 10*3/uL (ref 4.0–10.5)

## 2014-05-18 LAB — CLOSTRIDIUM DIFFICILE BY PCR: CDIFFPCR: POSITIVE — AB

## 2014-05-18 MED ORDER — BRIMONIDINE TARTRATE 0.2 % OP SOLN
1.0000 [drp] | Freq: Three times a day (TID) | OPHTHALMIC | Status: DC
  Administered 2014-05-18 – 2014-05-21 (×9): 1 [drp] via OPHTHALMIC
  Filled 2014-05-18: qty 5

## 2014-05-18 MED ORDER — METRONIDAZOLE IN NACL 5-0.79 MG/ML-% IV SOLN
500.0000 mg | Freq: Three times a day (TID) | INTRAVENOUS | Status: DC
  Administered 2014-05-18 – 2014-05-20 (×6): 500 mg via INTRAVENOUS
  Filled 2014-05-18 (×9): qty 100

## 2014-05-18 MED ORDER — ENOXAPARIN SODIUM 30 MG/0.3ML ~~LOC~~ SOLN
30.0000 mg | SUBCUTANEOUS | Status: DC
  Administered 2014-05-19 – 2014-05-21 (×2): 30 mg via SUBCUTANEOUS
  Filled 2014-05-18 (×4): qty 0.3

## 2014-05-18 MED ORDER — LORAZEPAM 2 MG/ML IJ SOLN
1.0000 mg | Freq: Once | INTRAMUSCULAR | Status: AC
  Administered 2014-05-18: 1 mg via INTRAVENOUS
  Filled 2014-05-18: qty 1

## 2014-05-18 MED ORDER — METOPROLOL SUCCINATE ER 100 MG PO TB24
100.0000 mg | ORAL_TABLET | Freq: Two times a day (BID) | ORAL | Status: DC
  Administered 2014-05-18 – 2014-05-21 (×5): 100 mg via ORAL
  Filled 2014-05-18 (×10): qty 1

## 2014-05-18 MED ORDER — SODIUM CHLORIDE 0.9 % IJ SOLN
3.0000 mL | Freq: Two times a day (BID) | INTRAMUSCULAR | Status: DC
  Administered 2014-05-18 – 2014-05-21 (×7): 3 mL via INTRAVENOUS

## 2014-05-18 MED ORDER — INSULIN ASPART 100 UNIT/ML ~~LOC~~ SOLN
0.0000 [IU] | Freq: Three times a day (TID) | SUBCUTANEOUS | Status: DC
  Administered 2014-05-19 (×2): 1 [IU] via SUBCUTANEOUS
  Administered 2014-05-20: 2 [IU] via SUBCUTANEOUS

## 2014-05-18 MED ORDER — ALUM & MAG HYDROXIDE-SIMETH 200-200-20 MG/5ML PO SUSP: 30.0000 mL | Freq: Four times a day (QID) | ORAL | Status: DC | PRN

## 2014-05-18 MED ORDER — DORZOLAMIDE HCL-TIMOLOL MAL 2-0.5 % OP SOLN
1.0000 [drp] | Freq: Three times a day (TID) | OPHTHALMIC | Status: DC
  Administered 2014-05-18 – 2014-05-21 (×9): 1 [drp] via OPHTHALMIC
  Filled 2014-05-18: qty 10

## 2014-05-18 MED ORDER — DEXTROSE 50 % IV SOLN
INTRAVENOUS | Status: AC
  Filled 2014-05-18: qty 50

## 2014-05-18 MED ORDER — ATORVASTATIN CALCIUM 20 MG PO TABS
20.0000 mg | ORAL_TABLET | Freq: Every day | ORAL | Status: DC
  Administered 2014-05-18 – 2014-05-21 (×4): 20 mg via ORAL
  Filled 2014-05-18 (×4): qty 1

## 2014-05-18 MED ORDER — ONDANSETRON HCL 4 MG/2ML IJ SOLN: 4.0000 mg | Freq: Four times a day (QID) | INTRAMUSCULAR | Status: DC | PRN

## 2014-05-18 MED ORDER — DEXTROSE 50 % IV SOLN
50.0000 mL | Freq: Once | INTRAVENOUS | Status: AC
  Administered 2014-05-18: 50 mL via INTRAVENOUS

## 2014-05-18 MED ORDER — AMLODIPINE BESYLATE 10 MG PO TABS
10.0000 mg | ORAL_TABLET | Freq: Every day | ORAL | Status: DC
  Administered 2014-05-18 – 2014-05-21 (×4): 10 mg via ORAL
  Filled 2014-05-18 (×4): qty 1

## 2014-05-18 MED ORDER — VANCOMYCIN 50 MG/ML ORAL SOLUTION
125.0000 mg | Freq: Four times a day (QID) | ORAL | Status: DC
  Administered 2014-05-18 – 2014-05-21 (×10): 125 mg via ORAL
  Filled 2014-05-18 (×15): qty 2.5

## 2014-05-18 MED ORDER — INSULIN ASPART 100 UNIT/ML ~~LOC~~ SOLN: 0.0000 [IU] | Freq: Every day | SUBCUTANEOUS | Status: DC

## 2014-05-18 MED ORDER — HYDROMORPHONE HCL 1 MG/ML IJ SOLN
INTRAMUSCULAR | Status: AC
  Filled 2014-05-18: qty 1

## 2014-05-18 MED ORDER — ACETAMINOPHEN 325 MG PO TABS
650.0000 mg | ORAL_TABLET | Freq: Four times a day (QID) | ORAL | Status: DC | PRN
  Administered 2014-05-18: 650 mg via ORAL
  Filled 2014-05-18: qty 2

## 2014-05-18 MED ORDER — PANTOPRAZOLE SODIUM 40 MG PO TBEC
40.0000 mg | DELAYED_RELEASE_TABLET | Freq: Every day | ORAL | Status: DC
  Administered 2014-05-18 – 2014-05-19 (×2): 40 mg via ORAL
  Filled 2014-05-18 (×2): qty 1

## 2014-05-18 MED ORDER — HYDROCODONE-ACETAMINOPHEN 5-325 MG PO TABS
1.0000 | ORAL_TABLET | Freq: Four times a day (QID) | ORAL | Status: DC | PRN
  Administered 2014-05-19 – 2014-05-21 (×7): 1 via ORAL
  Filled 2014-05-18 (×7): qty 1

## 2014-05-18 MED ORDER — HYDROMORPHONE HCL 1 MG/ML IJ SOLN
0.5000 mg | INTRAMUSCULAR | Status: DC | PRN
  Administered 2014-05-18 – 2014-05-20 (×4): 0.5 mg via INTRAVENOUS
  Filled 2014-05-18 (×2): qty 1

## 2014-05-18 MED ORDER — ACETAMINOPHEN 650 MG RE SUPP: 650.0000 mg | Freq: Four times a day (QID) | RECTAL | Status: DC | PRN

## 2014-05-18 MED ORDER — ONDANSETRON HCL 4 MG PO TABS: 4.0000 mg | ORAL_TABLET | Freq: Four times a day (QID) | ORAL | Status: DC | PRN

## 2014-05-18 MED ORDER — LATANOPROST 0.005 % OP SOLN
1.0000 [drp] | Freq: Every day | OPHTHALMIC | Status: DC
  Administered 2014-05-18 – 2014-05-20 (×3): 1 [drp] via OPHTHALMIC
  Filled 2014-05-18: qty 2.5

## 2014-05-18 MED ORDER — DORZOLAMIDE HCL-TIMOLOL MAL PF 22.3-6.8 MG/ML OP SOLN: 1.0000 [drp] | Freq: Three times a day (TID) | OPHTHALMIC | Status: DC

## 2014-05-18 MED ORDER — ASPIRIN 81 MG PO CHEW
81.0000 mg | CHEWABLE_TABLET | Freq: Every day | ORAL | Status: DC
  Administered 2014-05-18 – 2014-05-19 (×2): 81 mg via ORAL
  Filled 2014-05-18 (×2): qty 1

## 2014-05-18 NOTE — ED Notes (Signed)
Patient transported to X-ray 

## 2014-05-18 NOTE — Procedures (Signed)
I was present at this session.  I have reviewed the session itself and made appropriate changes.  Severely edematous.  Access press ok.  tol HD.  bp ^ with xs vol.  Recurrent non adherence  Divonte Senger L 1/26/20163:26 PM

## 2014-05-18 NOTE — ED Notes (Signed)
Dr. Ranae PalmsYelverton at the bedside, discussed that patient has received full amp of D50 and consumed orange juice. Also noted nystagmus in eyes, hx of seizures. MD acknowledges, no new orders at this time. Family updated by MD.

## 2014-05-18 NOTE — Progress Notes (Signed)
Patient has terminated her dialysis treatment AMA. Patient has been advised of possible consequences of non adherence. Despite urgings from HD staff, patient has signed her AMA.

## 2014-05-18 NOTE — Progress Notes (Signed)
Pt was brought down to the EEG lab. Pt refused test and wanted to go back up to her room. Transporter took her back to her room

## 2014-05-18 NOTE — H&P (Addendum)
History and Physical       Hospital Admission Note Date: 05/18/2014  Patient name: Christina [name] Medical record number: [id number] Date of birth: 05-Jan-1941 Age: 74 y.o. Gender: female  PCP: No PCP Per Patient    Chief Complaint:  Acute encephalopathy, hypoglycemia  HPI: Patient is a 74 year old female with hypertension hyperlipidemia, diabetes mellitus, ESRD on hemodialysis TTS, legally blind, wheelchair-bound, prior history of CVA was brought to ED by the family for altered mental status. History was obtained from the patient and her son in the room. Patient is much more alert and awake and oriented at time of my encounter. She reported that in the last couple of days she has been feeling sick, pain all over, chills but no fevers, vomiting or any diarrhea or dysuria (patient reports that she still makes some urine). Due to inclement weather, she missed her dialysis on Saturday. Family reported that patient is normally alert and oriented and this morning she was minimally responsive. Patient also reported that her blood sugars has been low in the last few days. Per EMS, CBG was 80. She received full amp of D50 and consumed orange juice, cbg 173 after that. Son at bedside reported no seizures or syncopal episode.  Review of Systems:  Constitutional: Denies fever, + chills, diaphoresis, + poor appetite and fatigue.  HEENT: Denies photophobia, eye pain, redness, hearing loss, ear pain, congestion, sore throat, rhinorrhea, sneezing, mouth sores, trouble swallowing, neck pain, neck stiffness and tinnitus.   Respiratory: Denies SOB, DOE, cough, chest tightness,  and wheezing.   Cardiovascular: Denies chest pain, palpitations and leg swelling.  Gastrointestinal: Denies vomiting, abdominal pain, diarrhea, constipation, blood in stool and abdominal distention. + nausea Genitourinary: Denies dysuria, urgency, frequency, hematuria, flank pain and  difficulty urinating.  Musculoskeletal: Denies myalgias, + chronic arthritis and back pain, wheelchair-bound Skin: Denies pallor, rash and wound.  Neurological: Denies dizziness, seizures, syncope, weakness, light-headedness, numbness and headaches.  Hematological: Denies adenopathy. Easy bruising, personal or family bleeding history  Psychiatric/Behavioral: Denies suicidal ideation, mood changes, confusion, nervousness, sleep disturbance and agitation  Past Medical History: Past Medical History  Diagnosis Date  . Hyperlipidemia   . Hypertension   . Chronic kidney disease   . Glaucoma   . Arthritis   . Dialysis patient   . Diabetes mellitus without complication   . Blind    Past Surgical History  Procedure Laterality Date  . Cholecystectomy      Medications: Prior to Admission medications   Medication Sig Start Date End Date Taking? Authorizing Provider  amLODipine (NORVASC) 10 MG tablet Take 10 mg by mouth daily.   Yes Historical Provider, MD  aspirin 81 MG tablet Take 81 mg by mouth daily.   Yes Historical Provider, MD  atorvastatin (LIPITOR) 20 MG tablet Take 20 mg by mouth daily.   Yes Historical Provider, MD  brimonidine (ALPHAGAN) 0.15 % ophthalmic solution Place 1 drop into the right eye 3 (three) times daily.    Yes Historical Provider, MD  Dorzolamide HCl-Timolol Mal PF 22.3-6.8 MG/ML SOLN Place 1 drop into the right eye 3 (three) times daily.    Yes Historical Provider, MD  Ibuprofen (ADVIL) 200 MG CAPS Take 400 mg by mouth every 6 (six) hours as needed (pain).    Yes Historical Provider, MD  insulin detemir (LEVEMIR) 100 UNIT/ML injection Inject 15 Units into the skin every evening.   Yes Historical Provider, MD  latanoprost (XALATAN) 0.005 % ophthalmic solution Place 1 drop into the right eye  at bedtime.    Yes Historical Provider, MD  metoprolol succinate (TOPROL-XL) 100 MG 24 hr tablet Take 100 mg by mouth every 12 (twelve) hours. Take with or immediately following a  meal.   Yes Historical Provider, MD  MULTIPLE VITAMIN PO Take 1 tablet by mouth daily.    Yes Historical Provider, MD  omeprazole (PRILOSEC) 20 MG capsule Take 20 mg by mouth 2 (two) times daily before a meal.   Yes Historical Provider, MD  docusate sodium (COLACE) 100 MG capsule Take 100 mg by mouth daily.    Historical Provider, MD  HYDROcodone-acetaminophen (NORCO/VICODIN) 5-325 MG per tablet Take 1 tablet by mouth every 6 (six) hours as needed for moderate pain. Patient not taking: Reported on 05/18/2014 03/15/14   Suzi Roots, MD  lisinopril (PRINIVIL,ZESTRIL) 20 MG tablet Take 20 mg by mouth daily.    Historical Provider, MD  meloxicam (MOBIC) 15 MG tablet Take 15 mg by mouth daily.    Historical Provider, MD    Allergies:   Allergies  Allergen Reactions  . Cheese     Pt states due to being on dialysis.  [name] Penicillins Hives    faintng  . Tomato     Pt states due to being on dialysis.    Social History:  reports that she has quit smoking. She does not have any smokeless tobacco history on file. Her alcohol and drug histories are not on file.  Family History: No family history on file.  Physical Exam: Blood pressure 167/90, pulse 60, resp. rate 18, height  (1.6 m), weight 69.854 kg (154 lb), SpO2 100 %. General: Alert, awake, oriented x3, in no acute distress. HEENT: normocephalic, atraumatic, anicteric sclera, pink conjunctiva, pupils equal and reactive to light and accomodation, oropharynx clear Neck: supple, no masses or lymphadenopathy, no goiter, no bruits  Heart: Regular rate and rhythm, without murmurs, rubs or gallops. Lungs: Clear to auscultation bilaterally, no wheezing, rales or rhonchi. Abdomen: Soft, nontender, nondistended, positive bowel sounds, no masses. Extremities: No clubbing, cyanosis or edema with positive pedal pulses. Right upper extremities be fistula with thrill Neuro: Grossly intact, no focal neurological deficits, strength 5/5 upper  extremities bilaterally, was unable to lift her lower extremities more than 1/5 (reports prior history of 5 strokes and lower extremity weakness is chronic, wheelchair-bound)  Psych: alert and oriented x 3, normal mood and affect Skin: no rashes or lesions, warm and dry   LABS on Admission:  Basic Metabolic Panel:  Recent Labs Lab 05/18/14 0445  NA 142  K 4.8  CL 104  CO2 22  GLUCOSE 55*  BUN 84*  CREATININE 11.23*  CALCIUM 8.8   Liver Function Tests:  Recent Labs Lab 05/18/14 0445  AST 23  ALT 16  ALKPHOS 72  BILITOT 0.6  PROT 7.6  ALBUMIN 3.4*   No results for input(s): LIPASE, AMYLASE in the last 168 hours. No results for input(s): AMMONIA in the last 168 hours. CBC:  Recent Labs Lab 05/18/14 0445  WBC 5.9  NEUTROABS 2.4  HGB 12.2  HCT 35.7*  MCV 97.3  PLT 158   Cardiac Enzymes:  Recent Labs Lab 05/18/14 0445  TROPONINI <0.03   BNP: Invalid input(s): POCBNP CBG:  Recent Labs Lab 05/18/14 0536 05/18/14 0635  GLUCAP 192* 173*     Radiological Exams on Admission: Ct Head Wo Contrast  05/18/2014   CLINICAL DATA:  Altered mental status, decreased responsiveness. Legally blind. Missed dialysis on Thursday due to snow.  EXAM: CT HEAD WITHOUT CONTRAST  TECHNIQUE: Contiguous axial images were obtained from the base of the skull through the vertex without intravenous contrast.  COMPARISON:  MRI of the brain April 27, 2013  FINDINGS: Patient is oblique scanner.  The ventricles and sulci are normal for age. No intraparenchymal hemorrhage, mass effect nor midline shift. Patchy supratentorial white and RIGHT pontine matter hypodensities are within normal range for patient's age and though non-specific suggest sequelae of chronic small vessel ischemic disease. No acute large vascular territory infarcts. Remote small LEFT cerebellar infarcts. Remote bilateral thalamus lacunar infarcts.  No abnormal extra-axial fluid collections. Basal cisterns are patent.  Moderate calcific atherosclerosis of the carotid siphons.  No skull fracture. Remote RIGHT nasal bone fracture. Oblong appearance of the RIGHT ocular globe can be seen with myopia or increased intraocular pressure. The mastoid aircells and included paranasal sinuses are well-aerated. Patient is edentulous.  IMPRESSION: No acute intracranial process, patient is obliqued in the scanner which could decrease sensitivity.  Involutional changes. Mild to moderate white matter changes can be seen with chronic small vessel ischemic disease. Remote bilateral thalamus and LEFT cerebellar remote infarcts.   Electronically Signed   By: Awilda Metro   On: 05/18/2014 05:11    Assessment/Plan Principal Problem:   Acute encephalopathy: Likely due to uremia, missed dialysis, hypoglycemia, metabolic encephalopathy, rule out CVA due to previous history of strokes. No leukocytosis or fevers. - Patient is now alert, oriented and stable, will admit to telemetry - CT head negative for any acute CVA however due to multiple prior strokes, will check MRI of the brain, if negative, will not pursue CVA workup, check chest x-ray, ammonia level.  - Patient's son did not report any syncopal episode or seizures. - Continue aspirin - Nephrology will be consulted for dialysis today, her regular day, missed dialysis on Saturday  Active Problems: History of seizures - Not on any seizure medications, will check EEG    Hyperlipidemia - Continue statin    Hypertension - Currently stable, continue amlodipine, Toprol-XL    Diabetes mellitus uncontrolled with hypoglycemia and renal complications - For now, hold off on Levemir, continue sliding scale insulin sensitive, check HbA1c    ESRD on hemodialysis -Nephrology to be consulted for hemodialysis today    GERD: - Continue PPI  DVT prophylaxis: Lovenox  CODE STATUS: Full CODE STATUS  Family Communication: Admission, patients condition and plan of care including tests  being ordered have been discussed with the patient and son  who indicates understanding and agree with the plan and Code Status   Further plan will depend as patient's clinical course evolves and further radiologic and laboratory data become available.   Time Spent on Admission: 1 hour  Kameren Pargas M.D. [facility] 05/18/2014, 7:36 AM Pager: [phone]  If 7PM-7AM, please contact night-coverage [email or web address] Password [email or web address]

## 2014-05-18 NOTE — ED Provider Notes (Signed)
CSN: [id number]     Arrival date & time 05/18/14  0436 History   First MD Initiated Contact with Patient 05/18/14 0440     Chief Complaint  Patient presents with  . Altered Mental Status     (Consider location/radiation/quality/duration/timing/severity/associated sxs/prior Treatment) HPI Patient is unable to contribute to history. Level V caveat applies. Patient is transported by EMS from home for altered mental status. Family told EMS that the patient is normally alert and oriented. They found her this morning minimally responsive. The patient missed her dialysis on Saturday due to inclement weather. Per EMS CBG was 80 en route. Vital signs remained stable. Past Medical History  Diagnosis Date  . Hyperlipidemia   . Hypertension   . Chronic kidney disease   . Glaucoma   . Arthritis   . Dialysis patient   . Diabetes mellitus without complication   . Blind    Past Surgical History  Procedure Laterality Date  . Cholecystectomy     No family history on file. History  Substance Use Topics  . Smoking status: Former Games developer  . Smokeless tobacco: Not on file  . Alcohol Use: Not on file   OB History    No data available     Review of Systems  Unable to perform ROS: Mental status change      Allergies  Cheese; Penicillins; and Tomato  Home Medications   Prior to Admission medications   Medication Sig Start Date End Date Taking? Authorizing Provider  amLODipine (NORVASC) 10 MG tablet Take 10 mg by mouth daily.   Yes Historical Provider, MD  aspirin 81 MG tablet Take 81 mg by mouth daily.   Yes Historical Provider, MD  atorvastatin (LIPITOR) 20 MG tablet Take 20 mg by mouth daily.   Yes Historical Provider, MD  brimonidine (ALPHAGAN) 0.15 % ophthalmic solution Place 1 drop into the right eye 3 (three) times daily.    Yes Historical Provider, MD  Dorzolamide HCl-Timolol Mal PF 22.3-6.8 MG/ML SOLN Place 1 drop into the right eye 3 (three) times daily.    Yes Historical  Provider, MD  Ibuprofen (ADVIL) 200 MG CAPS Take 400 mg by mouth every 6 (six) hours as needed (pain).    Yes Historical Provider, MD  insulin detemir (LEVEMIR) 100 UNIT/ML injection Inject 15 Units into the skin every evening.   Yes Historical Provider, MD  latanoprost (XALATAN) 0.005 % ophthalmic solution Place 1 drop into the right eye at bedtime.    Yes Historical Provider, MD  metoprolol succinate (TOPROL-XL) 100 MG 24 hr tablet Take 100 mg by mouth every 12 (twelve) hours. Take with or immediately following a meal.   Yes Historical Provider, MD  MULTIPLE VITAMIN PO Take 1 tablet by mouth daily.    Yes Historical Provider, MD  omeprazole (PRILOSEC) 20 MG capsule Take 20 mg by mouth 2 (two) times daily before a meal.   Yes Historical Provider, MD  docusate sodium (COLACE) 100 MG capsule Take 100 mg by mouth daily.    Historical Provider, MD  HYDROcodone-acetaminophen (NORCO/VICODIN) 5-325 MG per tablet Take 1 tablet by mouth every 6 (six) hours as needed for moderate pain. Patient not taking: Reported on 05/18/2014 03/15/14   Suzi Roots, MD  lisinopril (PRINIVIL,ZESTRIL) 20 MG tablet Take 20 mg by mouth daily.    Historical Provider, MD  meloxicam (MOBIC) 15 MG tablet Take 15 mg by mouth daily.    Historical Provider, MD   BP 164/69 mmHg  Pulse 61  Resp 14  Ht  (1.6 m)  Wt 154 lb (69.854 kg)  BMI 27.29 kg/m2  SpO2 99% Physical Exam  Constitutional: She appears well-developed and well-nourished. No distress.  HENT:  Head: Normocephalic and atraumatic.  Mouth/Throat: Oropharynx is clear and moist.  Eyes: EOM are normal. Pupils are equal, round, and reactive to light.  Pupils are 3 mm and reactive  Neck: Normal range of motion. Neck supple.  Cardiovascular: Normal rate and regular rhythm.  Exam reveals no gallop and no friction rub.   No murmur heard. Pulmonary/Chest: Effort normal and breath sounds normal. No respiratory distress. She has no wheezes. She has no rales. She  exhibits no tenderness.  Abdominal: Soft. Bowel sounds are normal. She exhibits no distension and no mass. There is no tenderness. There is no rebound and no guarding.  Musculoskeletal: Normal range of motion. She exhibits no edema or tenderness.  Right upper extremity AV fistula with palpable thrill.  Neurological:  Patient is awake and intermittently responds to name. She appears to be moving all extremities though lower extremities are much weaker than her upper extremities.  Skin: Skin is warm and dry. No rash noted. No erythema.  Nursing note and vitals reviewed.   ED Course  Procedures (including critical care time) Labs Review Labs Reviewed  CBC WITH DIFFERENTIAL/PLATELET - Abnormal; Notable for the following:    RBC 3.67 (*)    HCT 35.7 (*)    Neutrophils Relative % 41 (*)    Lymphocytes Relative 48 (*)    All other components within normal limits  COMPREHENSIVE METABOLIC PANEL - Abnormal; Notable for the following:    Glucose, Bld 55 (*)    BUN 84 (*)    Creatinine, Ser 11.23 (*)    Albumin 3.4 (*)    GFR calc non Af Amer 3 (*)    GFR calc Af Amer 3 (*)    Anion gap 16 (*)    All other components within normal limits  CBG MONITORING, ED - Abnormal; Notable for the following:    Glucose-Capillary 44 (*)    All other components within normal limits  CBG MONITORING, ED - Abnormal; Notable for the following:    Glucose-Capillary 192 (*)    All other components within normal limits  CBG MONITORING, ED - Abnormal; Notable for the following:    Glucose-Capillary 173 (*)    All other components within normal limits  TROPONIN I    Imaging Review Ct Head Wo Contrast  05/18/2014   CLINICAL DATA:  Altered mental status, decreased responsiveness. Legally blind. Missed dialysis on Thursday due to snow.  EXAM: CT HEAD WITHOUT CONTRAST  TECHNIQUE: Contiguous axial images were obtained from the base of the skull through the vertex without intravenous contrast.  COMPARISON:  MRI of  the brain April 27, 2013  FINDINGS: Patient is oblique scanner.  The ventricles and sulci are normal for age. No intraparenchymal hemorrhage, mass effect nor midline shift. Patchy supratentorial white and RIGHT pontine matter hypodensities are within normal range for patient's age and though non-specific suggest sequelae of chronic small vessel ischemic disease. No acute large vascular territory infarcts. Remote small LEFT cerebellar infarcts. Remote bilateral thalamus lacunar infarcts.  No abnormal extra-axial fluid collections. Basal cisterns are patent. Moderate calcific atherosclerosis of the carotid siphons.  No skull fracture. Remote RIGHT nasal bone fracture. Oblong appearance of the RIGHT ocular globe can be seen with myopia or increased intraocular pressure. The mastoid aircells and included paranasal sinuses are  well-aerated. Patient is edentulous.  IMPRESSION: No acute intracranial process, patient is obliqued in the scanner which could decrease sensitivity.  Involutional changes. Mild to moderate white matter changes can be seen with chronic small vessel ischemic disease. Remote bilateral thalamus and LEFT cerebellar remote infarcts.   Electronically Signed   By: Awilda Metroourtnay  Bloomer   On: 05/18/2014 05:11     EKG Interpretation   Date/Time:  Tuesday May 18 2014 04:49:45 EST Ventricular Rate:  57 PR Interval:  160 QRS Duration: 97 QT Interval:  480 QTC Calculation: 467 R Axis:     Text Interpretation:  Sinus rhythm RSR' in V1 or V2, right VCD or RVH Left  ventricular hypertrophy Confirmed by Ranae PalmsYELVERTON  MD, Tashima Scarpulla ([id number]) on  05/18/2014 5:08:33 AM      MDM   Final diagnoses:  Disorientation  Hypoglycemia    Patient presenting with altered mental status after missing dialysis on Saturday. Patient with improvement status after D50. Per family patient is still not at her baseline. CT head without acute findings. Discussed with Dr. Allena KatzPatel. Will admit to step down bed.   Loren Raceravid  Daksha Koone, MD 05/18/14 [phone]
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
## 2014-05-18 NOTE — ED Notes (Signed)
cbg is 173
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
## 2014-05-19 ENCOUNTER — Encounter (HOSPITAL_COMMUNITY): Payer: Self-pay | Admitting: Neurology

## 2014-05-19 DIAGNOSIS — E119 Type 2 diabetes mellitus without complications: Secondary | ICD-10-CM

## 2014-05-19 DIAGNOSIS — A047 Enterocolitis due to Clostridium difficile: Secondary | ICD-10-CM

## 2014-05-19 DIAGNOSIS — I639 Cerebral infarction, unspecified: Secondary | ICD-10-CM

## 2014-05-19 LAB — GLUCOSE, CAPILLARY
GLUCOSE-CAPILLARY: 159 mg/dL — AB (ref 70–99)
Glucose-Capillary: 197 mg/dL — ABNORMAL HIGH (ref 70–99)
Glucose-Capillary: 149 mg/dL — ABNORMAL HIGH (ref 70–99)
Glucose-Capillary: 136 mg/dL — ABNORMAL HIGH (ref 70–99)
Glucose-Capillary: 109 mg/dL — ABNORMAL HIGH (ref 70–99)

## 2014-05-19 LAB — LIPID PANEL
Cholesterol: 131 mg/dL (ref 0–200)
HDL: 41 mg/dL (ref >39)
LDL Cholesterol: 47 mg/dL (ref 0–99)
Total CHOL/HDL Ratio: 3.2 RATIO
Triglycerides: 214 mg/dL — ABNORMAL HIGH (ref <150)
VLDL: 43 mg/dL — AB (ref 0–40)

## 2014-05-19 MED ORDER — DOXERCALCIFEROL 4 MCG/2ML IV SOLN
5.0000 ug | INTRAVENOUS | Status: DC
  Administered 2014-05-20: 5 ug via INTRAVENOUS
  Filled 2014-05-19: qty 4

## 2014-05-19 MED ORDER — SACCHAROMYCES BOULARDII 250 MG PO CAPS
250.0000 mg | ORAL_CAPSULE | Freq: Two times a day (BID) | ORAL | Status: DC
  Administered 2014-05-19 – 2014-05-21 (×5): 250 mg via ORAL
  Filled 2014-05-19 (×6): qty 1

## 2014-05-19 MED ORDER — ASPIRIN EC 325 MG PO TBEC
325.0000 mg | DELAYED_RELEASE_TABLET | Freq: Every day | ORAL | Status: DC
  Administered 2014-05-20 – 2014-05-21 (×2): 325 mg via ORAL
  Filled 2014-05-19 (×2): qty 1
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
## 2014-05-19 NOTE — Progress Notes (Signed)
Patient ID: Christina [name]  female  WGN:[id number]    DOB: [birth year]    DOA: 05/18/2014  PCP: No PCP Per Patient   Brief history of present illness  Patient is a 74 year old female with hypertension hyperlipidemia, diabetes mellitus, ESRD on hemodialysis TTS, legally blind, wheelchair-bound, prior history of CVA was brought to ED by the family for altered mental status. History was obtained from the patient and her son in the room. Patient is much more alert and awake and oriented at time of my encounter. She reported that in the last couple of days she has been feeling sick, pain all over, chills but no fevers, vomiting or any diarrhea or dysuria (patient reports that she still makes some urine). Due to inclement weather, she missed her dialysis on Saturday. Family reported that patient is normally alert and oriented and this morning she was minimally responsive. Patient also reported that her blood sugars has been low in the last few days. Per EMS, CBG was 80. She received full amp of D50 and consumed orange juice, cbg 173 after that. Son at bedside reported no seizures or syncopal episode.  Assessment/Plan: Principal Problem:   Acute encephalopathy: Likely multifactorial due to uremia from missed dialysis, hypoglycemia, metabolic encephalopathy. MRI also showed incidental 2 small strokes. Encephalopathy has cleared and patient is currently alert and oriented. - MRI positive for 2 small acute infarct in the pons bilaterally, 4 mm acute infarct in the right posterior pons, 3 mm acute infarct in the left mid pons, additional chronic ischemic changes throughout the pons - Ordered 2-D echo, carotid Dopplers, placed on aspirin 325 mg daily, previously was on aspirin 81 mg daily - Lipid panel and hemoglobin A1c ordered - PT, OT evaluation, neuro consulted  Active Problems: C. difficile colitis - Continue oral vancomycin for 14 days. Discontinue PPI - Continue IV Flagyl, add probiotic   Hyperlipidemia - Continue statin, check lipid panel    Hypertension -Currently stable     Diabetes mellitus uncontrolled with hypoglycemia - CBGs currently stable, continue sliding scale insulin - Levemir currently on hold     ESRD on hemodialysis - Nephrology following, continue hemodialysis, TTS schedule  DVT Prophylaxis:Lovenox   Code Status:Full code    Family Communication: discussed with patient's daughter, Rudie Meyer   Disposition:  Consultants:  Nephrology    neurology  Procedures:  MRI of the brain   Antibiotics:  Oral vancomycin  IV Flagyl     Subjective: Patient seen and examined, feels a whole lot better today, alert and oriented, no acute complaints, still having some diarrhea   Objective: Weight change: 5.08 kg (11 lb 3.2 oz)  Intake/Output Summary (Last 24 hours) at 05/19/14 1405 Last data filed at 05/19/14 0947  Gross per 24 hour  Intake    940 ml  Output   2597 ml  Net  -1657 ml   Blood pressure 161/65, pulse 99, temperature 98.2 F (36.8 C), temperature source Oral, resp. rate 16, height  (1.702 m), weight 73 kg (160 lb 15 oz), SpO2 100 %.  Physical Exam: General: Alert and awake, oriented x3, not in any acute distress. CVS: S1-S2 clear, no murmur rubs or gallops Chest: clear to auscultation bilaterally, no wheezing, rales or rhonchi Abdomen: soft nontender, nondistended, normal bowel sounds  Extremities: no cyanosis, clubbing or edema noted bilaterally  Lab Results: Basic Metabolic Panel:  Recent Labs Lab 05/18/14 0445 05/18/14 1337  NA 142 140  K 4.8 5.5*  CL 104 105  CO2 22 20  GLUCOSE 55* 118*  BUN 84* 83*  CREATININE 11.23* 11.57*  CALCIUM 8.8 8.6  PHOS  --  6.2*   Liver Function Tests:  Recent Labs Lab 05/18/14 0445 05/18/14 1337  AST 23  --   ALT 16  --   ALKPHOS 72  --   BILITOT 0.6  --   PROT 7.6  --   ALBUMIN 3.4* 3.3*   No results for input(s): LIPASE, AMYLASE in the last 168 hours. No  results for input(s): AMMONIA in the last 168 hours. CBC:  Recent Labs Lab 05/18/14 0445 05/18/14 1337  WBC 5.9 4.8  NEUTROABS 2.4  --   HGB 12.2 11.2*  HCT 35.7* 32.4*  MCV 97.3 95.9  PLT 158 149*   Cardiac Enzymes:  Recent Labs Lab 05/18/14 0445  TROPONINI <0.03   BNP: Invalid input(s): POCBNP CBG:  Recent Labs Lab 05/18/14 1109 05/18/14 1728 05/18/14 2146 05/19/14 0558 05/19/14 1128  GLUCAP 73 82 197* 149* 136*     Micro Results: Recent Results (from the past 240 hour(s))  Clostridium Difficile by PCR     Status: Abnormal   Collection Time: 05/18/14 10:16 AM  Result Value Ref Range Status   C difficile by pcr POSITIVE (A) NEGATIVE Final    Comment: CRITICAL RESULT CALLED TO, READ BACK BY AND VERIFIED WITH: Ashok Cordia RN 11:55 05/18/14 ([email or web address])     Studies/Results: Dg Chest 1 View  05/18/2014   CLINICAL DATA:  Left-sided chest pain.  EXAM: CHEST - 1 VIEW  COMPARISON:  March 15, 2014.  FINDINGS: The heart size and mediastinal contours are within normal limits. Both lungs are clear. No pneumothorax or pleural effusion is noted. The visualized skeletal structures are unremarkable.  IMPRESSION: No acute cardiopulmonary abnormality seen.   Electronically Signed   By: Roque Lias M.D.   On: 05/18/2014 08:17   Ct Head Wo Contrast  05/18/2014   CLINICAL DATA:  Altered mental status, decreased responsiveness. Legally blind. Missed dialysis on Thursday due to snow.  EXAM: CT HEAD WITHOUT CONTRAST  TECHNIQUE: Contiguous axial images were obtained from the base of the skull through the vertex without intravenous contrast.  COMPARISON:  MRI of the brain April 27, 2013  FINDINGS: Patient is oblique scanner.  The ventricles and sulci are normal for age. No intraparenchymal hemorrhage, mass effect nor midline shift. Patchy supratentorial white and RIGHT pontine matter hypodensities are within normal range for patient's age and though non-specific suggest sequelae of chronic  small vessel ischemic disease. No acute large vascular territory infarcts. Remote small LEFT cerebellar infarcts. Remote bilateral thalamus lacunar infarcts.  No abnormal extra-axial fluid collections. Basal cisterns are patent. Moderate calcific atherosclerosis of the carotid siphons.  No skull fracture. Remote RIGHT nasal bone fracture. Oblong appearance of the RIGHT ocular globe can be seen with myopia or increased intraocular pressure. The mastoid aircells and included paranasal sinuses are well-aerated. Patient is edentulous.  IMPRESSION: No acute intracranial process, patient is obliqued in the scanner which could decrease sensitivity.  Involutional changes. Mild to moderate white matter changes can be seen with chronic small vessel ischemic disease. Remote bilateral thalamus and LEFT cerebellar remote infarcts.   Electronically Signed   By: Awilda Metro   On: 05/18/2014 05:11   Mr Brain Wo Contrast  05/18/2014   CLINICAL DATA:  Acute encephalopathy. Hypertension and hyperlipidemia and diabetes. Dialysis patient.  EXAM: MRI HEAD WITHOUT CONTRAST  TECHNIQUE: Multiplanar, multiecho pulse sequences of the brain and surrounding  structures were obtained without intravenous contrast.  COMPARISON:  CT head 05/18/2014  FINDINGS: 4 mm acute infarct right posterior pons. 3 mm acute infarct left mid pons. No other acute infarct is identified  Generalized atrophy. Mild chronic microvascular ischemic changes in the white matter. Chronic lacunar infarctions in the central and right pons extending into the middle cerebellar peduncle bilaterally. Chronic infarct in the left superior cerebellum.  Chronic microhemorrhage in the basal ganglia bilaterally and in the cerebellar vermis. No fluid collection or hematoma.  Negative for mass or edema.  Paranasal sinuses are clear.  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 Bilateral lens replacements.  IMPRESSION: Two small acute infarcts in the  pons bilaterally. Additional chronic ischemic change throughout the pons.  Mild chronic micro hemorrhage.  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   Electronically Signed   By: Marlan Palauharles  Clark M.D.   On: 05/18/2014 10:29    Medications: Scheduled Meds: . amLODipine  10 mg Oral Daily  . aspirin  81 mg Oral Daily  . atorvastatin  20 mg Oral Daily  . brimonidine  1 drop Right Eye TID  . dorzolamide-timolol  1 drop Right Eye TID  . [START ON 05/20/2014] doxercalciferol  5 mcg Intravenous Q T,Th,Sa-HD  . enoxaparin (LOVENOX) injection  30 mg Subcutaneous Q24H  . insulin aspart  0-5 Units Subcutaneous QHS  . insulin aspart  0-9 Units Subcutaneous TID WC  . latanoprost  1 drop Right Eye QHS  . metoprolol succinate  100 mg Oral BID WC  . metronidazole  500 mg Intravenous 3 times per day  . pantoprazole  40 mg Oral Daily  . sodium chloride  3 mL Intravenous Q12H  . vancomycin  125 mg Oral QID      LOS: 1 day   Mishael Haran M.D. [facility] 05/19/2014, 2:05 PM Pager: [phone]  If 7PM-7AM, please contact night-coverage [email or web address] Password [email or web address]

## 2014-05-20 DIAGNOSIS — I6789 Other cerebrovascular disease: Secondary | ICD-10-CM

## 2014-05-20 DIAGNOSIS — I639 Cerebral infarction, unspecified: Secondary | ICD-10-CM | POA: Insufficient documentation

## 2014-05-20 LAB — GLUCOSE, CAPILLARY
GLUCOSE-CAPILLARY: 104 mg/dL — AB (ref 70–99)
Glucose-Capillary: 173 mg/dL — ABNORMAL HIGH (ref 70–99)
Glucose-Capillary: 182 mg/dL — ABNORMAL HIGH (ref 70–99)

## 2014-05-20 LAB — RENAL FUNCTION PANEL
ANION GAP: 14 (ref 5–15)
Albumin: 3.1 g/dL — ABNORMAL LOW (ref 3.5–5.2)
BUN: 48 mg/dL — ABNORMAL HIGH (ref 6–23)
CO2: 23 mmol/L (ref 19–32)
Calcium: 8.8 mg/dL (ref 8.4–10.5)
Chloride: 101 mmol/L (ref 96–112)
Creatinine, Ser: 8.93 mg/dL — ABNORMAL HIGH (ref 0.50–1.10)
GFR calc non Af Amer: 4 mL/min — ABNORMAL LOW (ref >90)
GFR, EST AFRICAN AMERICAN: 5 mL/min — AB (ref >90)
Glucose, Bld: 184 mg/dL — ABNORMAL HIGH (ref 70–99)
Phosphorus: 6.2 mg/dL — ABNORMAL HIGH (ref 2.3–4.6)
Potassium: 4.4 mmol/L (ref 3.5–5.1)
Sodium: 138 mmol/L (ref 135–145)

## 2014-05-20 LAB — HEMOGLOBIN A1C
HEMOGLOBIN A1C: 7.7 % — AB (ref 4.8–5.6)
Mean Plasma Glucose: 174 mg/dL

## 2014-05-20 MED ORDER — HYDROMORPHONE HCL 1 MG/ML IJ SOLN
INTRAMUSCULAR | Status: AC
  Administered 2014-05-20: 0.5 mg via INTRAVENOUS
  Filled 2014-05-20: qty 1

## 2014-05-20 MED ORDER — DOXERCALCIFEROL 4 MCG/2ML IV SOLN
INTRAVENOUS | Status: AC
  Administered 2014-05-20: 5 ug via INTRAVENOUS
  Filled 2014-05-20: qty 4

## 2014-05-20 NOTE — Progress Notes (Signed)
Subjective:  No complaints  Objective Vital signs in last 24 hours: Filed Vitals:   05/20/14 1100 05/20/14 1130 05/20/14 1200 05/20/14 1230  BP: 119/65 106/57 143/73 168/75  Pulse: 54 54 55 61  Temp:    97.9 F (36.6 C)  TempSrc:    Oral  Resp: 12 14 12 13   Height:      Weight:    72.7 kg (160 lb 4.4 oz)  SpO2:       Weight change: -1.434 kg (-3 lb 2.6 oz)  Physical Exam: General: Alert Ox3 , NAD   Lungs- CTA  Card-RRR, no mur, rub,or gallop Abd- Obese, soft , NT,ND Ext- 1+ pedal edema Dialysis Access- pos bruit  RUA AVF   OPHD= NW on TTS 4h 68.5kg 2/2.25 [place] RUA AVF Heparin 1900 Hect 8 ug TIW   Problem/Plan: 1. AMS - 2 pontine CVA's, missed HD, multifactorial 2. Acute CVA - per primary/ neuro 3. Cdif colitis - on po vanc  4. Hypoglycemic episode- resolved 5. ESRD on HD TTS - continue 6. 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 HTN /vol - up 4kg , UF with HD today 8. Anemia  hgb 11.2, no esa  Vinson Moselleob Shaeley Segall MD pager [phone]    cell [phone] 05/20/2014, 1:21 PM    Labs: Basic Metabolic Panel:  Recent Labs Lab 05/18/14 0445 05/18/14 1337  NA 142 140  K 4.8 5.5*  CL 104 105  CO2 22 20  GLUCOSE 55* 118*  BUN 84* 83*  CREATININE 11.23* 11.57*  CALCIUM 8.8 8.6  PHOS  --  6.2*   Liver Function Tests:  Recent Labs Lab 05/18/14 0445 05/18/14 1337  AST 23  --   ALT 16  --   ALKPHOS 72  --   BILITOT 0.6  --   PROT 7.6  --   ALBUMIN 3.4* 3.3*  CBC:  Recent Labs Lab 05/18/14 0445 05/18/14 1337  WBC 5.9 4.8  NEUTROABS 2.4  --   HGB 12.2 11.2*  HCT 35.7* 32.4*  MCV 97.3 95.9  PLT 158 149*   Cardiac Enzymes:  Recent Labs Lab 05/18/14 0445  TROPONINI <0.03   CBG:  Recent Labs Lab 05/19/14 0558 05/19/14 1128 05/19/14 1717 05/19/14 2113 05/20/14 0549  GLUCAP 149* 136* 109* 159* 104*    Studies/Results: No results found. Medications:   . amLODipine  10 mg Oral Daily  . aspirin EC  325 mg Oral  Daily  . atorvastatin  20 mg Oral Daily  . brimonidine  1 drop Right Eye TID  . dorzolamide-timolol  1 drop Right Eye TID  . doxercalciferol  5 mcg Intravenous Q T,Th,Sa-HD  . enoxaparin (LOVENOX) injection  30 mg Subcutaneous Q24H  . insulin aspart  0-5 Units Subcutaneous QHS  . insulin aspart  0-9 Units Subcutaneous TID WC  . latanoprost  1 drop Right Eye QHS  . metoprolol succinate  100 mg Oral BID WC  . saccharomyces boulardii  250 mg Oral BID  . sodium chloride  3 mL Intravenous Q12H  . vancomycin  125 mg Oral QID

## 2014-05-20 NOTE — Progress Notes (Signed)
Patient ID: Christina [name]  female  WUJ:[id number]    DOB: 01/21/1941    DOA: 05/18/2014  PCP: No PCP Per Patient   Brief history of present illness  Patient is a 74 year old female with hypertension hyperlipidemia, diabetes mellitus, ESRD on hemodialysis TTS, legally blind, wheelchair-bound, prior history of CVA was brought to ED by the family for altered mental status. History was obtained from the patient and her son in the room. Patient is much more alert and awake and oriented at time of my encounter. She reported that in the last couple of days she has been feeling sick, pain all over, chills but no fevers, vomiting or any diarrhea or dysuria (patient reports that she still makes some urine). Due to inclement weather, she missed her dialysis on Saturday. Family reported that patient is normally alert and oriented and this morning she was minimally responsive. Patient also reported that her blood sugars has been low in the last few days. Per EMS, CBG was 80. She received full amp of D50 and consumed orange juice, cbg 173 after that. Son at bedside reported no seizures or syncopal episode.  Assessment/Plan: Principal Problem:   Acute encephalopathy: Likely multifactorial due to uremia from missed dialysis, hypoglycemia, metabolic encephalopathy. MRI also showed incidental 2 small strokes. Encephalopathy has cleared and patient is currently alert and oriented. - MRI positive for 2 small acute infarct in the pons bilaterally, 4 mm acute infarct in the right posterior pons, 3 mm acute infarct in the left mid pons, additional chronic ischemic changes throughout the pons - Neurology consulted, 2-D echo pending, carotid Dopplers pending, EEG done, results pending  - On aspirin 325 mg daily, stroke team following  - Lipid panel showed LDL 47 - Hemoglobin A1c 7.7 on insulin - PT, OT evaluation, neuro consulted  Active Problems: C. difficile colitis - Continue oral vancomycin for 14 days. Discontinue  PPI    Hyperlipidemia - Continue statin, LDL 47    Hypertension -Currently stable     Diabetes mellitus uncontrolled with hypoglycemia - CBGs currently stable, continue sliding scale insulin - Levemir currently on hold     ESRD on hemodialysis - Nephrology following, continue hemodialysis, TTS schedule  DVT Prophylaxis:Lovenox   Code Status:Full code    Family Communication: discussed with patient's daughter, Rudie Meyer yesterday  Disposition:  Consultants:  Nephrology    neurology  Procedures:  MRI of the brain   Antibiotics:  Oral vancomycin  IV Flagyl     Subjective: Diarrhea improving, no chest pain or shortness of breath, no fevers or chills, patient reports that her left-sided weakness is improving  Objective: Weight change: -1.434 kg (-3 lb 2.6 oz)  Intake/Output Summary (Last 24 hours) at 05/20/14 1149 Last data filed at 05/20/14 0950  Gross per 24 hour  Intake   1080 ml  Output    120 ml  Net    960 ml   Blood pressure 130/68, pulse 59, temperature 98.1 F (36.7 C), temperature source Oral, resp. rate 13, height  (1.702 m), weight 73.5 kg (162 lb 0.6 oz), SpO2 100 %.  Physical Exam: General: Alert and awake, oriented x3, not in any acute distress. CVS: S1-S2 clear, no murmur rubs or gallops Chest: clear to auscultation bilaterally, no wheezing, rales or rhonchi Abdomen: soft nontender, nondistended, normal bowel sounds  Extremities: no cyanosis, clubbing or edema noted bilaterally Neuro: Slight stuttering, right upper and lower extremities 5/5, right LE 4/5, LLE 2-3/5  Lab Results: Basic Metabolic Panel:  Recent Labs Lab 05/18/14 0445 05/18/14 1337  NA 142 140  K 4.8 5.5*  CL 104 105  CO2 22 20  GLUCOSE 55* 118*  BUN 84* 83*  CREATININE 11.23* 11.57*  CALCIUM 8.8 8.6  PHOS  --  6.2*   Liver Function Tests:  Recent Labs Lab 05/18/14 0445 05/18/14 1337  AST 23  --   ALT 16  --   ALKPHOS 72  --   BILITOT 0.6   --   PROT 7.6  --   ALBUMIN 3.4* 3.3*   No results for input(s): LIPASE, AMYLASE in the last 168 hours. No results for input(s): AMMONIA in the last 168 hours. CBC:  Recent Labs Lab 05/18/14 0445 05/18/14 1337  WBC 5.9 4.8  NEUTROABS 2.4  --   HGB 12.2 11.2*  HCT 35.7* 32.4*  MCV 97.3 95.9  PLT 158 149*   Cardiac Enzymes:  Recent Labs Lab 05/18/14 0445  TROPONINI <0.03   BNP: Invalid input(s): POCBNP CBG:  Recent Labs Lab 05/19/14 0558 05/19/14 1128 05/19/14 1717 05/19/14 2113 05/20/14 0549  GLUCAP 149* 136* 109* 159* 104*     Micro Results: Recent Results (from the past 240 hour(s))  Clostridium Difficile by PCR     Status: Abnormal   Collection Time: 05/18/14 10:16 AM  Result Value Ref Range Status   C difficile by pcr POSITIVE (A) NEGATIVE Final    Comment: CRITICAL RESULT CALLED TO, READ BACK BY AND VERIFIED WITH: Ashok Cordia RN 11:55 05/18/14 ([email or web address])     Studies/Results: Dg Chest 1 View  05/18/2014   CLINICAL DATA:  Left-sided chest pain.  EXAM: CHEST - 1 VIEW  COMPARISON:  March 15, 2014.  FINDINGS: The heart size and mediastinal contours are within normal limits. Both lungs are clear. No pneumothorax or pleural effusion is noted. The visualized skeletal structures are unremarkable.  IMPRESSION: No acute cardiopulmonary abnormality seen.   Electronically Signed   By: Roque Lias M.D.   On: 05/18/2014 08:17   Ct Head Wo Contrast  05/18/2014   CLINICAL DATA:  Altered mental status, decreased responsiveness. Legally blind. Missed dialysis on Thursday due to snow.  EXAM: CT HEAD WITHOUT CONTRAST  TECHNIQUE: Contiguous axial images were obtained from the base of the skull through the vertex without intravenous contrast.  COMPARISON:  MRI of the brain April 27, 2013  FINDINGS: Patient is oblique scanner.  The ventricles and sulci are normal for age. No intraparenchymal hemorrhage, mass effect nor midline shift. Patchy supratentorial white and RIGHT  pontine matter hypodensities are within normal range for patient's age and though non-specific suggest sequelae of chronic small vessel ischemic disease. No acute large vascular territory infarcts. Remote small LEFT cerebellar infarcts. Remote bilateral thalamus lacunar infarcts.  No abnormal extra-axial fluid collections. Basal cisterns are patent. Moderate calcific atherosclerosis of the carotid siphons.  No skull fracture. Remote RIGHT nasal bone fracture. Oblong appearance of the RIGHT ocular globe can be seen with myopia or increased intraocular pressure. The mastoid aircells and included paranasal sinuses are well-aerated. Patient is edentulous.  IMPRESSION: No acute intracranial process, patient is obliqued in the scanner which could decrease sensitivity.  Involutional changes. Mild to moderate white matter changes can be seen with chronic small vessel ischemic disease. Remote bilateral thalamus and LEFT cerebellar remote infarcts.   Electronically Signed   By: Awilda Metro   On: 05/18/2014 05:11   Mr Brain Wo Contrast  05/18/2014   CLINICAL DATA:  Acute encephalopathy. Hypertension and hyperlipidemia and  diabetes. Dialysis patient.  EXAM: MRI HEAD WITHOUT CONTRAST  TECHNIQUE: Multiplanar, multiecho pulse sequences of the brain and surrounding structures were obtained without intravenous contrast.  COMPARISON:  CT head 05/18/2014  FINDINGS: 4 mm acute infarct right posterior pons. 3 mm acute infarct left mid pons. No other acute infarct is identified  Generalized atrophy. Mild chronic microvascular ischemic changes in the white matter. Chronic lacunar infarctions in the central and right pons extending into the middle cerebellar peduncle bilaterally. Chronic infarct in the left superior cerebellum.  Chronic microhemorrhage in the basal ganglia bilaterally and in the cerebellar vermis. No fluid collection or hematoma.  Negative for mass or edema.  Paranasal sinuses are clear.  Moderate to large  staphyloma of the right globe, unchanged and chronic. Bilateral lens replacements.  IMPRESSION: Two small acute infarcts in the pons bilaterally. Additional chronic ischemic change throughout the pons.  Mild chronic micro hemorrhage.  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   Electronically Signed   By: Marlan Palauharles  Clark M.D.   On: 05/18/2014 10:29    Medications: Scheduled Meds: . amLODipine  10 mg Oral Daily  . aspirin EC  325 mg Oral Daily  . atorvastatin  20 mg Oral Daily  . brimonidine  1 drop Right Eye TID  . dorzolamide-timolol  1 drop Right Eye TID  . doxercalciferol  5 mcg Intravenous Q T,Th,Sa-HD  . enoxaparin (LOVENOX) injection  30 mg Subcutaneous Q24H  . insulin aspart  0-5 Units Subcutaneous QHS  . insulin aspart  0-9 Units Subcutaneous TID WC  . latanoprost  1 drop Right Eye QHS  . metoprolol succinate  100 mg Oral BID WC  . saccharomyces boulardii  250 mg Oral BID  . sodium chloride  3 mL Intravenous Q12H  . vancomycin  125 mg Oral QID      LOS: 2 days   Anaeli Cornwall M.D. [facility] 05/20/2014, 11:49 AM Pager: [phone]  If 7PM-7AM, please contact night-coverage [email or web address] Password [email or web address]

## 2014-05-20 NOTE — Progress Notes (Signed)
OT Cancellation Note  Patient Details Name: Brendolyn PattyLaura Gartley MRN: [id number] DOB: 1941/02/13   Cancelled Treatment:    Reason Eval/Treat Not Completed: Patient at procedure or test/ unavailable (HD.  Will continue to follow.)  Evern BioMayberry, Javonn Gauger Lynn 05/20/2014, 10:29 AM  [phone]

## 2014-05-20 NOTE — Progress Notes (Signed)
PT Cancellation Note  Patient Details Name: Christina [name] MRN: [id number] DOB: [birth year]   Cancelled Treatment:    Reason Eval/Treat Not Completed: Patient at procedure or test/unavailable. Pt at dialysis. Will check back as schedule permits. Clydie BraunKaren L. Katrinka BlazingSmith, [place]PT Pager [phone] 05/20/2014    [name] 05/20/2014, 9:28 AM
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
## 2014-05-20 NOTE — Progress Notes (Signed)
  Echocardiogram 2D Echocardiogram has been performed.  Aris EvertsRix, Unita Detamore A 05/20/2014, 3:16 PM

## 2014-05-20 NOTE — Care Management Note (Signed)
    Page 1 of 1   05/21/2014     4:28:37 PM CARE MANAGEMENT NOTE 05/21/2014  [facility]   Account Number:  [id number]  Date Initiated:  05/20/2014  Documentation initiated by:  Kelson Queenan  Subjective/Objective Assessment:   Pt adm on 05/18/14 with encephalopathy, pos cdiff.  PTA, pt resides at home with son and daughter in law.  She is mostly WC bound, but states she can "take steps" with RW.     Action/Plan:   Await PT/OT recommendations.  Will follow for home needs as pt progresses.   Anticipated DC Date:  05/21/2014   Anticipated DC Plan:  HOME W HOME HEALTH SERVICES      DC Planning Services  CM consult      [facility]   Choice offered to / List presented to:  C-4 Adult Children        HH arranged  HH-1 RN  HH-2 PT  HH-3 OT  HH-4 NURSE'S AIDE      HH agency  [facility]   Status of service:  Completed, signed off Medicare Important Message given?  YES (If response is "NO", the following Medicare IM given date fields will be blank) Date Medicare IM given:  05/21/2014 Medicare IM given by:  Ashey Tramontana Date Additional Medicare IM given:   Additional Medicare IM given by:    Discharge Disposition:  HOME W HOME HEALTH SERVICES  Per UR Regulation:  Reviewed for med. necessity/level of care/duration of stay  If discussed at Long Length of Stay Meetings, dates discussed:    Comments:  05/21/14 Sidney AceJulie Royal Vandevoort, RN, BSN [phone] Pt for dc home today with family.  Will need HH follow up ; [facility] and wishes to use again.  No DME needs, per pt/daughter in law.  [facility] dc date.

## 2014-05-21 LAB — GLUCOSE, CAPILLARY: GLUCOSE-CAPILLARY: 111 mg/dL — AB (ref 70–99)

## 2014-05-21 MED ORDER — VANCOMYCIN 50 MG/ML ORAL SOLUTION: 125.0000 mg | Freq: Four times a day (QID) | ORAL | Status: DC

## 2014-05-21 MED ORDER — ASPIRIN 325 MG PO TBEC: 325.0000 mg | DELAYED_RELEASE_TABLET | Freq: Every day | ORAL | Status: AC

## 2014-05-21 MED ORDER — INSULIN ASPART 100 UNIT/ML ~~LOC~~ SOLN: SUBCUTANEOUS | Status: AC

## 2014-05-21 MED ORDER — HYDROCODONE-ACETAMINOPHEN 5-325 MG PO TABS: 1.0000 | ORAL_TABLET | Freq: Four times a day (QID) | ORAL | Status: DC | PRN

## 2014-05-21 MED ORDER — SACCHAROMYCES BOULARDII 250 MG PO CAPS: 250.0000 mg | ORAL_CAPSULE | Freq: Two times a day (BID) | ORAL | Status: DC
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
## 2014-05-21 NOTE — Progress Notes (Signed)
ED CM received call from [facility] regarding insulin clarification. Reviewed record clarification provided.

## 2014-05-21 NOTE — Evaluation (Signed)
Occupational Therapy Evaluation and Discharge Patient Details Name: Christina [name] MRN: [id number] DOB: 10-30-1940 Today's Date: 05/21/2014    History of Present Illness Patient is a 74 yo female with Acute encephalopathy: Likely multifactorial due to uremia from missed dialysis, hypoglycemia, metabolic encephalopathy. MRI also showed incidental 2 small strokes. Incidental findings on MRI for 2 small acute infarcts in the pons bilaterally.   Clinical Impression   This 74 yo female admitted with above presents to acute OT with generalized weakness, decreased mobility (which was there to some degree pta), decreased balance when up on her feet, decreased use of LUE all affecting her baseline level of functioning. She will benefit from continued HHOT. We will D/C due to pt to D/C home today.    Follow Up Recommendations  Home health OT    Equipment Recommendations  None recommended by OT       Precautions / Restrictions Precautions Precautions: Fall Precaution Comments: legally blind  Restrictions Weight Bearing Restrictions: No      Mobility Bed Mobility Overal bed mobility: Needs Assistance Bed Mobility: Supine to Sit;Sit to Supine     Supine to sit: Supervision Sit to supine: Supervision       Balance Overall balance assessment: History of Falls                                          ADL Overall ADL's : Needs assistance/impaired Eating/Feeding: Set up;Bed level   Grooming: Set up;Supervision/safety;Sitting   Upper Body Bathing: Supervision/ safety;Set up;Sitting   Lower Body Bathing: Moderate assistance;Sitting/lateral leans   Upper Body Dressing : Minimal assistance;Sitting   Lower Body Dressing: Maximal assistance;Sitting/lateral leans                                 Pertinent Vitals/Pain Pain Assessment: Faces Faces Pain Scale: Hurts a little bit Pain Location: LLE Pain Descriptors / Indicators: Aching Pain  Intervention(s): Monitored during session     Hand Dominance Right   Extremity/Trunk Assessment Upper Extremity Assessment Upper Extremity Assessment: LUE deficits/detail LUE Coordination: decreased fine motor;decreased gross motor       Communication Communication Communication: No difficulties   Cognition Arousal/Alertness: Awake/alert Behavior During Therapy: WFL for tasks assessed/performed Overall Cognitive Status: No family/caregiver present to determine baseline cognitive functioning                                Home Living Family/patient expects to be discharged to:: Private residence Living Arrangements: Children Available Help at Discharge: Family Type of Home: House Home Access: Ramped entrance     Home Layout: Able to live on main level with bedroom/bathroom     Bathroom Shower/Tub: Producer, television/film/videoWalk-in shower   Bathroom Toilet: Standard     Home Equipment: Environmental consultantWalker - 2 wheels;Bedside commode;Wheelchair - manual;Shower seat;Grab bars - tub/shower;Grab bars - toilet          Prior Functioning/Environment Level of Independence: Needs assistance  Gait / Transfers Assistance Needed: takes minimal pivotal steps at home with RW and hands on assist ADL's / Homemaking Assistance Needed: family assists with set up for self care--helps wash lower legs and feet as well as put on socks and shoes        OT Diagnosis: Generalized weakness;Acute pain   OT Problem  List: Impaired UE functional use;Pain;Impaired balance (sitting and/or standing);Decreased strength      OT Goals(Current goals can be found in the care plan section) Acute Rehab OT Goals Patient Stated Goal: home today  OT Frequency:                End of Session    Activity Tolerance: Patient tolerated treatment well Patient left: in bed;with call bell/phone within reach;with bed alarm set   Time: 1221-1249 OT Time Calculation (min): 28 min Charges:  OT General Charges $OT Visit: 1  Procedure OT Evaluation $Initial OT Evaluation Tier I: 1 Procedure OT Treatments $Self Care/Home Management : 8-22 mins  Evette Georges [phone] 05/21/2014, 2:08 PM

## 2014-05-21 NOTE — Discharge Summary (Signed)
Physician Discharge Summary  Patient ID: Christina [name] MRN: [id number] DOB/AGE: January 22, 1941 74 y.o.  Admit date: 05/18/2014 Discharge date: 05/21/2014  Primary Care Physician:  No PCP Per Patient  Discharge Diagnoses:   . Acute encephalopathy . Hypoglycemia . C. difficile colitis . CVA (cerebral infarction) . Hypertension . Hyperlipidemia   Consults:  Nephrology, Dr. Arta SilenceShertz Neurology, Dr. Pearlean BrownieSethi   Recommendations for Outpatient Follow-up:  New medication: Aspirin 325 mg daily  Oral vancomycin 125 mg 4 times a day for 11 more days    Levemir has been discontinued as patient presented with hypoglycemia with CBG of 44.     DIET: Carb modified diet    Allergies:   Allergies  Allergen Reactions  . Cheese     Pt states due to being on dialysis.  [name]. Penicillins Hives    faintng  . Tomato     Pt states due to being on dialysis.     Discharge Medications:   Medication List    STOP taking these medications        ADVIL 200 MG Caps  Generic drug:  Ibuprofen     aspirin 81 MG tablet  Replaced by:  aspirin 325 MG EC tablet     docusate sodium 100 MG capsule  Commonly known as:  COLACE     insulin detemir 100 UNIT/ML injection  Commonly known as:  LEVEMIR     lisinopril 20 MG tablet  Commonly known as:  PRINIVIL,ZESTRIL     meloxicam 15 MG tablet  Commonly known as:  MOBIC     omeprazole 20 MG capsule  Commonly known as:  PRILOSEC      TAKE these medications        amLODipine 10 MG tablet  Commonly known as:  NORVASC  Take 10 mg by mouth daily.     aspirin 325 MG EC tablet  Take 1 tablet (325 mg total) by mouth daily.     atorvastatin 20 MG tablet  Commonly known as:  LIPITOR  Take 20 mg by mouth daily.     brimonidine 0.15 % ophthalmic solution  Commonly known as:  ALPHAGAN  Place 1 drop into the right eye 3 (three) times daily.     Dorzolamide HCl-Timolol Mal PF 22.3-6.8 MG/ML Soln  Place 1 drop into the right eye 3 (three) times daily.      HYDROcodone-acetaminophen 5-325 MG per tablet  Commonly known as:  NORCO/VICODIN  Take 1 tablet by mouth every 6 (six) hours as needed for moderate pain.     insulin aspart 100 UNIT/ML injection  Commonly known as:  novoLOG  Sliding scale CBG 70 - 120: 0 units CBG 121 - 150: 1 unit,  CBG 151 - 200: 2 units,  CBG 201 - 250: 3 units,  CBG 251 - 300: 5 units,  CBG 301 - 350: 7 units,  CBG 351 - 400: 9 units   CBG > 400: 9 units and notify your MD     latanoprost 0.005 % ophthalmic solution  Commonly known as:  XALATAN  Place 1 drop into the right eye at bedtime.     metoprolol succinate 100 MG 24 hr tablet  Commonly known as:  TOPROL-XL  Take 100 mg by mouth every 12 (twelve) hours. Take with or immediately following a meal.     MULTIPLE VITAMIN PO  Take 1 tablet by mouth daily.     saccharomyces boulardii 250 MG capsule  Commonly known as:  FLORASTOR  Take 1  capsule (250 mg total) by mouth 2 (two) times daily.     vancomycin 50 mg/mL oral solution  Commonly known as:  VANCOCIN  Take 2.5 mLs (125 mg total) by mouth 4 (four) times daily. X 11 more days         Brief H and P: For complete details please refer to admission H and P, but in brief Patient is a 74 year old female with hypertension hyperlipidemia, diabetes mellitus, ESRD on hemodialysis TTS, legally blind, wheelchair-bound, prior history of CVA was brought to ED by the family for altered mental status. History was obtained from the patient and her son in the room. Patient is much more alert and awake and oriented at time of my encounter. She reported that in the last couple of days she has been feeling sick, pain all over, chills but no fevers, vomiting or any diarrhea or dysuria (patient reports that she still makes some urine). Due to inclement weather, she missed her dialysis on Saturday. Family reported that patient is normally alert and oriented and this morning she was minimally responsive. Patient also reported that  her blood sugars has been low in the last few days. Per EMS, CBG was 80. She received full amp of D50 and consumed orange juice, cbg 173 after that. Son at bedside reported no seizures or syncopal episode.  Hospital Course:  Acute encephalopathy: Likely multifactorial due to uremia from missed dialysis, hypoglycemia, metabolic encephalopathy. MRI also showed incidental 2 small strokes. Encephalopathy has cleared and patient is currently alert and oriented. - MRI positive for 2 small acute infarct in the pons bilaterally, 4 mm acute infarct in the right posterior pons, 3 mm acute infarct in the left mid pons, additional chronic ischemic changes throughout the pons - Neurology was consulted and patient underwent full stroke workup., 2-D echo showed EF of 55-60%, grade 1 diastolic dysfunction. Carotid Dopplers showed 1-39% ICA stenosis. Patient was on aspirin 81 mg daily prior to admission, increased to 325 milligrams daily by neurology team.  Lipid panel showed LDL 47, continue statin Hemoglobin A1c 7.7 poor control however patient presented with hypoglycemia, Levemir currently on hold  PT, OT evaluation recommended home health PT. Patient lives with her family. Home health PT, OT, home health care, RN was arranged.  Osteoarthritis The patient is legally blind and has chronic left-sided pain from arthritis. Continue pain control  C. difficile colitis Patient was placed on oral vancomycin for a total of 14 days. PPI was discontinued. Patient was also placed on Florastor    Hyperlipidemia - Continue statin, LDL 47   Hypertension -Currently stable   Diabetes mellitus uncontrolled with hypoglycemia on admission - CBGs currently stable, continue sliding scale insulin - Levemir currently on hold    ESRD on hemodialysis - Nephrology was consulted and patient underwent hemodialysis per her schedule.    Day of Discharge BP 165/69 mmHg  Pulse 65  Temp(Src) 97.9 F (36.6 C) (Oral)  Resp  20  Ht 5\' 7"  (1.702 m)  Wt 71.4 kg (157 lb 6.5 oz)  BMI 24.65 kg/m2  SpO2 99%  Physical Exam: General: Alert and awake oriented x3 not in any acute distress. HEENT: anicteric sclera, pupils reactive to light and accommodation CVS: S1-S2 clear no murmur rubs or gallops Chest: clear to auscultation bilaterally, no wheezing rales or rhonchi Abdomen: soft nontender, nondistended, normal bowel sounds Extremities: no cyanosis, clubbing or edema noted bilaterally Neuro: Cranial nerves II-XII intact, no focal neurological deficits   The results of significant  diagnostics from this hospitalization (including imaging, microbiology, ancillary and laboratory) are listed below for reference.    LAB RESULTS: Basic Metabolic Panel:  Recent Labs Lab 05/18/14 1337 05/20/14 1335  NA 140 138  K 5.5* 4.4  CL 105 101  CO2 20 23  GLUCOSE 118* 184*  BUN 83* 48*  CREATININE 11.57* 8.93*  CALCIUM 8.6 8.8  PHOS 6.2* 6.2*   Liver Function Tests:  Recent Labs Lab 05/18/14 0445 05/18/14 1337 05/20/14 1335  AST 23  --   --   ALT 16  --   --   ALKPHOS 72  --   --   BILITOT 0.6  --   --   PROT 7.6  --   --   ALBUMIN 3.4* 3.3* 3.1*   No results for input(s): LIPASE, AMYLASE in the last 168 hours. No results for input(s): AMMONIA in the last 168 hours. CBC:  Recent Labs Lab 05/18/14 0445 05/18/14 1337  WBC 5.9 4.8  NEUTROABS 2.4  --   HGB 12.2 11.2*  HCT 35.7* 32.4*  MCV 97.3 95.9  PLT 158 149*   Cardiac Enzymes:  Recent Labs Lab 05/18/14 0445  TROPONINI <0.03   BNP: Invalid input(s): POCBNP CBG:  Recent Labs Lab 05/20/14 2208 05/21/14 0547  GLUCAP 182* 111*    Significant Diagnostic Studies:  Dg Chest 1 View  05/18/2014   CLINICAL DATA:  Left-sided chest pain.  EXAM: CHEST - 1 VIEW  COMPARISON:  March 15, 2014.  FINDINGS: The heart size and mediastinal contours are within normal limits. Both lungs are clear. No pneumothorax or pleural effusion is noted. The  visualized skeletal structures are unremarkable.  IMPRESSION: No acute cardiopulmonary abnormality seen.   Electronically Signed   By: Roque Lias M.D.   On: 05/18/2014 08:17   Ct Head Wo Contrast  05/18/2014   CLINICAL DATA:  Altered mental status, decreased responsiveness. Legally blind. Missed dialysis on Thursday due to snow.  EXAM: CT HEAD WITHOUT CONTRAST  TECHNIQUE: Contiguous axial images were obtained from the base of the skull through the vertex without intravenous contrast.  COMPARISON:  MRI of the brain April 27, 2013  FINDINGS: Patient is oblique scanner.  The ventricles and sulci are normal for age. No intraparenchymal hemorrhage, mass effect nor midline shift. Patchy supratentorial white and RIGHT pontine matter hypodensities are within normal range for patient's age and though non-specific suggest sequelae of chronic small vessel ischemic disease. No acute large vascular territory infarcts. Remote small LEFT cerebellar infarcts. Remote bilateral thalamus lacunar infarcts.  No abnormal extra-axial fluid collections. Basal cisterns are patent. Moderate calcific atherosclerosis of the carotid siphons.  No skull fracture. Remote RIGHT nasal bone fracture. Oblong appearance of the RIGHT ocular globe can be seen with myopia or increased intraocular pressure. The mastoid aircells and included paranasal sinuses are well-aerated. Patient is edentulous.  IMPRESSION: No acute intracranial process, patient is obliqued in the scanner which could decrease sensitivity.  Involutional changes. Mild to moderate white matter changes can be seen with chronic small vessel ischemic disease. Remote bilateral thalamus and LEFT cerebellar remote infarcts.   Electronically Signed   By: Awilda Metro   On: 05/18/2014 05:11   Mr Brain Wo Contrast  05/18/2014   CLINICAL DATA:  Acute encephalopathy. Hypertension and hyperlipidemia and diabetes. Dialysis patient.  EXAM: MRI HEAD WITHOUT CONTRAST  TECHNIQUE:  Multiplanar, multiecho pulse sequences of the brain and surrounding structures were obtained without intravenous contrast.  COMPARISON:  CT head 05/18/2014  FINDINGS:  4 mm acute infarct right posterior pons. 3 mm acute infarct left mid pons. No other acute infarct is identified  Generalized atrophy. Mild chronic microvascular ischemic changes in the white matter. Chronic lacunar infarctions in the central and right pons extending into the middle cerebellar peduncle bilaterally. Chronic infarct in the left superior cerebellum.  Chronic microhemorrhage in the basal ganglia bilaterally and in the cerebellar vermis. No fluid collection or hematoma.  Negative for mass or edema.  Paranasal sinuses are clear.  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 Bilateral lens replacements.  IMPRESSION: Two small acute infarcts in the pons bilaterally. Additional chronic ischemic change throughout the pons.  Mild chronic micro hemorrhage.  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   Electronically Signed   By: Marlan Palau M.D.   On: 05/18/2014 10:29    2D ECHO: Study Conclusions  - Left ventricle: The cavity size was normal. Wall thickness was increased in a pattern of mild LVH. Systolic function was normal. The estimated ejection fraction was in the range of 55% to 60%. Wall motion was normal; there were no regional wall motion abnormalities. Doppler parameters are consistent with abnormal left ventricular relaxation (grade 1 diastolic dysfunction). The E/e&' ratio is between 8-15, suggesting indeterminate LV filling pressure. - Aortic valve: Sclerosis without stenosis. There was trivial regurgitation. - Mitral valve: Calcified annulus. Mildly thickened leaflets . There was mild regurgitation. - Left atrium: The atrium was normal in size.   Disposition and Follow-up: Discharge Instructions    Diet Carb Modified    Complete by:  As directed      Increase  activity slowly    Complete by:  As directed             DISPOSITION: home with home PT, OT, home health aide, RN   DISCHARGE FOLLOW-UP     Follow-up Information    Follow up with SETHI,PRAMOD, MD. Schedule an appointment as soon as possible for a visit in 2 months.   Specialties:  Neurology, Radiology   Why:  for hospital follow-up, stroke clinic   Contact information:   [address] [place] [address] [phone]        Time spent on Discharge: 35 mins  Signed:   RAI,RIPUDEEP M.D. [facility] 05/21/2014, 1:52 PM Pager: [phone]

## 2014-05-21 NOTE — Evaluation (Signed)
Physical Therapy Evaluation Patient Details Name: Christina [name] MRN: [id number] DOB: 03-14-1941 Today's Date: 05/21/2014   History of Present Illness  Patient is a 74 yo female with Acute encephalopathy: Likely multifactorial due to uremia from missed dialysis, hypoglycemia, metabolic encephalopathy. MRI also showed incidental 2 small strokes. Incidental findings on MRI for 2 small acute infarcts in the pons bilaterally.  Clinical Impression  Patient demonstrates deficits in functional mobility as indicated below. Will need continued skilled PT to address deficits and maximize function. Will see as indicated and progress as tolerated.  OF NOTE: spoke with daughter in law via phone in room with patient, patient left sided pain is NOT NEW, has had this for years despite patient insisting this is from her new strokes. Patient inconsistent throughout session with with responses to pain disproportionate to movements. Patient significantly anxious with moving insisting she cant do it and needs 2 people to help despite her actually performing tasks with no physical assist. Patient became very emotional  And distraught during session with poor understanding of cues and conversation. At this time feel patient may benefit from trial of HHPT but appears manageable with home equipment and assist.     Follow Up Recommendations Home health PT;Supervision/Assistance - 24 hour    Equipment Recommendations       Recommendations for Other Services       Precautions / Restrictions Precautions Precautions: Fall Precaution Comments: legally blind  Restrictions Weight Bearing Restrictions: No      Mobility  Bed Mobility Overal bed mobility: Needs Assistance Bed Mobility: Supine to Sit;Sit to Supine     Supine to sit: Supervision Sit to supine: Supervision   General bed mobility comments: increased time but no physical assist required, despite patient verbally reporting that she "can't do it"    Transfers Overall transfer level: Needs assistance   Transfers: Sit to/from Stand;Stand Pivot Transfers Sit to Stand: Min guard;Min assist Stand pivot transfers: Min guard;Min assist       General transfer comment: Patient extremely anxious during mobility reporting she can't perform activity because of her "stroke on her left side" Max cues to decrease anxiety and assist with recognition that patient WAS in fact performing the tasks WITHOUT physical assist. Patient began to become significantly emotional. During tranfers patient was able to standin with very minimal HHA and tactile cues to position hands on destination   Ambulation/Gait                Stairs            Wheelchair Mobility    Modified Rankin (Stroke Patients Only)       Balance Overall balance assessment: History of Falls                                           Pertinent Vitals/Pain Pain Assessment: Faces Faces Pain Scale: Hurts little more Pain Location: left hip pain Pain Descriptors / Indicators: Grimacing    Home Living Family/patient expects to be discharged to:: Private residence Living Arrangements: Children Available Help at Discharge: Family Type of Home: House Home Access: Ramped entrance     Home Layout: Able to live on main level with bedroom/bathroom Home Equipment: Walker - 2 wheels;Bedside commode;Wheelchair - manual;Shower seat      Prior Function Level of Independence: Needs assistance   Gait / Transfers Assistance Needed: takes minimal pivotal steps at  home with RW and hands on assist  ADL's / Homemaking Assistance Needed: family assists with set up for self care         Hand Dominance   Dominant Hand: Right    Extremity/Trunk Assessment               Lower Extremity Assessment: LLE deficits/detail (Strength symmterical 4/5)      Cervical / Trunk Assessment:  (increased body habitus)  Communication   Communication:  (legally  bilnd)  Cognition Arousal/Alertness: Awake/alert Behavior During Therapy: Anxious Overall Cognitive Status: No family/caregiver present to determine baseline cognitive functioning                      General Comments      Exercises        Assessment/Plan    PT Assessment Patient needs continued PT services  PT Diagnosis Difficulty walking;Acute pain;Altered mental status   PT Problem List    PT Treatment Interventions DME instruction;Gait training;Functional mobility training;Therapeutic activities;Therapeutic exercise;Balance training;Patient/family education;Wheelchair mobility training   PT Goals (Current goals can be found in the Care Plan section) Acute Rehab PT Goals Patient Stated Goal: to go home PT Goal Formulation: With patient/family Time For Goal Achievement: 06/04/14 Potential to Achieve Goals: Fair    Frequency Min 3X/week   Barriers to discharge        Co-evaluation               End of Session Equipment Utilized During Treatment: Gait belt Activity Tolerance: Patient limited by lethargy;Patient limited by pain;Other (comment) (patient anxiety and emotional response) Patient left: in bed;with call bell/phone within reach;with bed alarm set Nurse Communication: Mobility status         Time: 4098-1191 PT Time Calculation (min) (ACUTE ONLY): 26 min   Charges:   PT Evaluation $Initial PT Evaluation Tier I: 1 Procedure PT Treatments $Self Care/Home Management: 8-22   PT G CodesFabio Asa Jun 07, 2014, 1:41 PM Charlotte Crumb, PT DPT  [phone]

## 2014-05-21 NOTE — Progress Notes (Signed)
VASCULAR LAB PRELIMINARY  PRELIMINARY  PRELIMINARY  PRELIMINARY  Carotid duplex  completed.    Preliminary report:  Bilateral:  1-39% ICA stenosis.  Vertebral artery flow is antegrade.      Edgardo Petrenko, RVT 05/21/2014, 10:12 AM

## 2014-06-21 ENCOUNTER — Other Ambulatory Visit: Payer: Self-pay | Admitting: Nephrology

## 2014-06-21 ENCOUNTER — Ambulatory Visit
Admission: RE | Admit: 2014-06-21 | Discharge: 2014-06-21 | Disposition: A | Discharge status: Home / Self Care | Payer: Medicare Other | Source: Ambulatory Visit | Attending: Nephrology | Admitting: Nephrology

## 2014-06-21 DIAGNOSIS — Z139 Encounter for screening, unspecified: Secondary | ICD-10-CM

## 2014-06-30 ENCOUNTER — Ambulatory Visit: Payer: Medicare Other | Admitting: Podiatry

## 2014-07-07 ENCOUNTER — Encounter: Payer: Self-pay | Admitting: Podiatry

## 2014-07-07 ENCOUNTER — Ambulatory Visit (INDEPENDENT_AMBULATORY_CARE_PROVIDER_SITE_OTHER): Payer: Medicare Other | Admitting: Podiatry

## 2014-07-07 VITALS — BP 144/72 | HR 60 | Resp 12

## 2014-07-07 DIAGNOSIS — B351 Tinea unguium: Secondary | ICD-10-CM | POA: Diagnosis not present

## 2014-07-07 DIAGNOSIS — M2042 Other hammer toe(s) (acquired), left foot: Secondary | ICD-10-CM | POA: Diagnosis not present

## 2014-07-07 DIAGNOSIS — L84 Corns and callosities: Secondary | ICD-10-CM

## 2014-07-07 DIAGNOSIS — M2041 Other hammer toe(s) (acquired), right foot: Secondary | ICD-10-CM

## 2014-07-07 DIAGNOSIS — I639 Cerebral infarction, unspecified: Secondary | ICD-10-CM

## 2014-07-07 DIAGNOSIS — E1151 Type 2 diabetes mellitus with diabetic peripheral angiopathy without gangrene: Secondary | ICD-10-CM

## 2014-07-07 NOTE — Patient Instructions (Signed)
Wear the toe crest on the left foot daily and remove at night Place the remaining of the toe crest on the third left toe  Diabetes and Foot Care Diabetes may cause you to have problems because of poor blood supply (circulation) to your feet and legs. This may cause the skin on your feet to become thinner, break easier, and heal more slowly. Your skin may become dry, and the skin may peel and crack. You may also have nerve damage in your legs and feet causing decreased feeling in them. You may not notice minor injuries to your feet that could lead to infections or more serious problems. Taking care of your feet is one of the most important things you can do for yourself.  HOME CARE INSTRUCTIONS  Wear shoes at all times, even in the house. Do not go barefoot. Bare feet are easily injured.  Check your feet daily for blisters, cuts, and redness. If you cannot see the bottom of your feet, use a mirror or ask someone for help.  Wash your feet with warm water (do not use hot water) and mild soap. Then pat your feet and the areas between your toes until they are completely dry. Do not soak your feet as this can dry your skin.  Apply a moisturizing lotion or petroleum jelly (that does not contain alcohol and is unscented) to the skin on your feet and to dry, brittle toenails. Do not apply lotion between your toes.  Trim your toenails straight across. Do not dig under them or around the cuticle. File the edges of your nails with an emery board or nail file.  Do not cut corns or calluses or try to remove them with medicine.  Wear clean socks or stockings every day. Make sure they are not too tight. Do not wear knee-high stockings since they may decrease blood flow to your legs.  Wear shoes that fit properly and have enough cushioning. To break in new shoes, wear them for just a few hours a day. This prevents you from injuring your feet. Always look in your shoes before you put them on to be sure there are  no objects inside.  Do not cross your legs. This may decrease the blood flow to your feet.  If you find a minor scrape, cut, or break in the skin on your feet, keep it and the skin around it clean and dry. These areas may be cleansed with mild soap and water. Do not cleanse the area with peroxide, alcohol, or iodine.  When you remove an adhesive bandage, be sure not to damage the skin around it.  If you have a wound, look at it several times a day to make sure it is healing.  Do not use heating pads or hot water bottles. They may burn your skin. If you have lost feeling in your feet or legs, you may not know it is happening until it is too late.  Make sure your health care provider performs a complete foot exam at least annually or more often if you have foot problems. Report any cuts, sores, or bruises to your health care provider immediately. SEEK MEDICAL CARE IF:   You have an injury that is not healing.  You have cuts or breaks in the skin.  You have an ingrown nail.  You notice redness on your legs or feet.  You feel burning or tingling in your legs or feet.  You have pain or cramps in your legs  and feet.  Your legs or feet are numb.  Your feet always feel cold. SEEK IMMEDIATE MEDICAL CARE IF:   There is increasing redness, swelling, or pain in or around a wound.  There is a red line that goes up your leg.  Pus is coming from a wound.  You develop a fever or as directed by your health care provider.  You notice a bad smell coming from an ulcer or wound. Document Released: 04/06/2000 Document Revised: 12/10/2012 Document Reviewed: 09/16/2012 [facility] Patient Information 2015 [place], [place]. This information is not intended to replace advice given to you by your health care provider. Make sure you discuss any questions you have with your health care provider.

## 2014-07-07 NOTE — Progress Notes (Signed)
   Subjective:    Patient ID: Christina [name], female    DOB: 1940-09-16, 74 y.o.   MRN: [id number]  HPI  N-SORE, DISCOLORATION L-LT FOOT 2ND TOE D-3 WEEKS O-SUDDENLY C-BETTER A-PRESSURE T-NONE  Patient's daughter states that her primary care physician is referred patient here for follow-up evaluation for a painful second left toe.  Patient is also complaining of painful toenails and requests nail debridement  Review of Systems  Musculoskeletal: Positive for back pain, gait problem and neck pain.  Skin: Positive for color change.  Neurological: Positive for weakness and headaches.  Hematological: Bruises/bleeds easily.       Objective:   Physical Exam  Patient appears responsive to questioning with her daughter present in the treatment room Patient is able to transfer from wheelchair to treatment chair  Vascular: DP pulses 0/4 bilaterally PT pulses 0/4 bilaterally  Neurological: Sensation to 10 g monofilament wire intact 2/5 right and 4/5 left Vibratory sensation reactive bilaterally Ankle reflexes weekly reactive bilaterally  Dermatological: The distal aspect of the second second left toe has hemorrhagic keratoses without any drainage, warmth, edema The toenails are elongated, hypertrophic, discolored and tender to palpation 6-10  Musculoskeletal: Pes planus bilaterally HAV deformities bilaterally Hammertoe deformities 2-4 bilaterally Patient is nonambulatory, however, is able to transfer       Assessment & Plan:   Assessment: Peripheral arterial disease Pre-ulcerative keratoses distal second left toe associated with hammertoe deformity Symptomatic onychomycoses 6-10 Nonambulatory patient Visually impaired  Plan: Debridement toenails 10 without any bleeding Dispensed silicone toe crest to replace on third toe left foot to elevate toes 2-4 left Wear the silicone toe crest during the day and remove at night  Reappoint at three-month intervals for nail  debridement or sooner if patient has concern

## 2014-07-16 ENCOUNTER — Encounter (HOSPITAL_COMMUNITY): Payer: Self-pay | Admitting: Emergency Medicine

## 2014-07-16 ENCOUNTER — Emergency Department (HOSPITAL_COMMUNITY): Payer: Medicare Other

## 2014-07-16 ENCOUNTER — Emergency Department (HOSPITAL_COMMUNITY)
Admission: EM | Admit: 2014-07-16 | Discharge: 2014-07-16 | Disposition: A | Discharge status: Home / Self Care | Payer: Medicare Other | Attending: Emergency Medicine | Admitting: Emergency Medicine

## 2014-07-16 DIAGNOSIS — Z880 Allergy status to penicillin: Secondary | ICD-10-CM | POA: Insufficient documentation

## 2014-07-16 DIAGNOSIS — E785 Hyperlipidemia, unspecified: Secondary | ICD-10-CM | POA: Diagnosis not present

## 2014-07-16 DIAGNOSIS — Z79899 Other long term (current) drug therapy: Secondary | ICD-10-CM | POA: Insufficient documentation

## 2014-07-16 DIAGNOSIS — N186 End stage renal disease: Secondary | ICD-10-CM | POA: Insufficient documentation

## 2014-07-16 DIAGNOSIS — Z7982 Long term (current) use of aspirin: Secondary | ICD-10-CM | POA: Insufficient documentation

## 2014-07-16 DIAGNOSIS — Z992 Dependence on renal dialysis: Secondary | ICD-10-CM | POA: Insufficient documentation

## 2014-07-16 DIAGNOSIS — Z87891 Personal history of nicotine dependence: Secondary | ICD-10-CM | POA: Diagnosis not present

## 2014-07-16 DIAGNOSIS — I120 Hypertensive chronic kidney disease with stage 5 chronic kidney disease or end stage renal disease: Secondary | ICD-10-CM | POA: Diagnosis not present

## 2014-07-16 DIAGNOSIS — M25552 Pain in left hip: Secondary | ICD-10-CM | POA: Diagnosis present

## 2014-07-16 DIAGNOSIS — H548 Legal blindness, as defined in USA: Secondary | ICD-10-CM | POA: Insufficient documentation

## 2014-07-16 DIAGNOSIS — M1990 Unspecified osteoarthritis, unspecified site: Secondary | ICD-10-CM | POA: Insufficient documentation

## 2014-07-16 DIAGNOSIS — M542 Cervicalgia: Secondary | ICD-10-CM | POA: Diagnosis not present

## 2014-07-16 DIAGNOSIS — M5432 Sciatica, left side: Secondary | ICD-10-CM | POA: Diagnosis not present

## 2014-07-16 DIAGNOSIS — Z794 Long term (current) use of insulin: Secondary | ICD-10-CM | POA: Diagnosis not present

## 2014-07-16 DIAGNOSIS — E119 Type 2 diabetes mellitus without complications: Secondary | ICD-10-CM | POA: Diagnosis not present

## 2014-07-16 MED ORDER — OXYCODONE-ACETAMINOPHEN 5-325 MG PO TABS: 2.0000 | ORAL_TABLET | ORAL | Status: DC | PRN

## 2014-07-16 MED ORDER — OXYCODONE-ACETAMINOPHEN 5-325 MG PO TABS
2.0000 | ORAL_TABLET | Freq: Once | ORAL | Status: AC
  Administered 2014-07-16: 2 via ORAL
  Filled 2014-07-16: qty 2

## 2014-07-16 MED ORDER — CYCLOBENZAPRINE HCL 5 MG PO TABS: 5.0000 mg | ORAL_TABLET | Freq: Three times a day (TID) | ORAL | Status: DC | PRN

## 2014-07-16 NOTE — Discharge Instructions (Signed)
Sciatica Sciatica is pain, weakness, numbness, or tingling along the path of the sciatic nerve. The nerve starts in the lower back and runs down the back of each leg. The nerve controls the muscles in the lower leg and in the back of the knee, while also providing sensation to the back of the thigh, lower leg, and the sole of your foot. Sciatica is a symptom of another medical condition. For instance, nerve damage or certain conditions, such as a herniated disk or bone spur on the spine, pinch or put pressure on the sciatic nerve. This causes the pain, weakness, or other sensations normally associated with sciatica. Generally, sciatica only affects one side of the body. CAUSES   Herniated or slipped disc.  Degenerative disk disease.  A pain disorder involving the narrow muscle in the buttocks (piriformis syndrome).  Pelvic injury or fracture.  Pregnancy.  Tumor (rare). SYMPTOMS  Symptoms can vary from mild to very severe. The symptoms usually travel from the low back to the buttocks and down the back of the leg. Symptoms can include:  Mild tingling or dull aches in the lower back, leg, or hip.  Numbness in the back of the calf or sole of the foot.  Burning sensations in the lower back, leg, or hip.  Sharp pains in the lower back, leg, or hip.  Leg weakness.  Severe back pain inhibiting movement. These symptoms may get worse with coughing, sneezing, laughing, or prolonged sitting or standing. Also, being overweight may worsen symptoms. DIAGNOSIS  Your caregiver will perform a physical exam to look for common symptoms of sciatica. He or she may ask you to do certain movements or activities that would trigger sciatic nerve pain. Other tests may be performed to find the cause of the sciatica. These may include:  Blood tests.  X-rays.  Imaging tests, such as an MRI or CT scan. TREATMENT  Treatment is directed at the cause of the sciatic pain. Sometimes, treatment is not necessary  and the pain and discomfort goes away on its own. If treatment is needed, your caregiver may suggest:  Over-the-counter medicines to relieve pain.  Prescription medicines, such as anti-inflammatory medicine, muscle relaxants, or narcotics.  Applying heat or ice to the painful area.  Steroid injections to lessen pain, irritation, and inflammation around the nerve.  Reducing activity during periods of pain.  Exercising and stretching to strengthen your abdomen and improve flexibility of your spine. Your caregiver may suggest losing weight if the extra weight makes the back pain worse.  Physical therapy.  Surgery to eliminate what is pressing or pinching the nerve, such as a bone spur or part of a herniated disk. HOME CARE INSTRUCTIONS   Only take over-the-counter or prescription medicines for pain or discomfort as directed by your caregiver.  Apply ice to the affected area for 20 minutes, 3-4 times a day for the first 48-72 hours. Then try heat in the same way.  Exercise, stretch, or perform your usual activities if these do not aggravate your pain.  Attend physical therapy sessions as directed by your caregiver.  Keep all follow-up appointments as directed by your caregiver.  Do not wear high heels or shoes that do not provide proper support.  Check your mattress to see if it is too soft. A firm mattress may lessen your pain and discomfort. SEEK IMMEDIATE MEDICAL CARE IF:   You lose control of your bowel or bladder (incontinence).  You have increasing weakness in the lower back, pelvis, buttocks,   or legs.  You have redness or swelling of your back.  You have a burning sensation when you urinate.  You have pain that gets worse when you lie down or awakens you at night.  Your pain is worse than you have experienced in the past.  Your pain is lasting longer than 4 weeks.  You are suddenly losing weight without reason. MAKE SURE YOU:  Understand these  instructions.  Will watch your condition.  Will get help right away if you are not doing well or get worse. Document Released: 04/03/2001 Document Revised: 10/09/2011 Document Reviewed: 08/19/2011 Exit[employer] Patient Information 2015 [employer]. This information is not intended to replace advice given to you by your health care provider. Make sure you discuss any questions you have with your health care provider.  

## 2014-07-16 NOTE — Progress Notes (Signed)
EDCM spoke to patient at bedside.  Patient confims she has Medicare insurnace.  Patient reports [place].  Patient requesting list of pcps who accept [place] in [place].  [facility] with a list of pcps who accept Medicare insurance within a ten mile radius of patient's zip code [phone].  Patient thankful for resources.  No further EDCM needs at this time.

## 2014-07-16 NOTE — ED Notes (Signed)
Bladder scan showed zero ml. Nurse was notified.

## 2014-07-16 NOTE — ED Notes (Signed)
Pt from home c/o left sided pain from hip all the way down, she reports this started today.  Pt denies injury.

## 2014-07-16 NOTE — ED Notes (Signed)
Bed: [id number] Expected date:  Expected time:  Means of arrival:  Comments: Hold for T1

## 2014-07-16 NOTE — ED Notes (Signed)
Bladder scan showed no urine in pt's bladder. MD notified.

## 2014-07-16 NOTE — ED Provider Notes (Signed)
CSN: [id number]     Arrival date & time 07/16/14  1522 History   First MD Initiated Contact with Patient 07/16/14 1604     Chief Complaint  Patient presents with  . Hip Pain     (Consider location/radiation/quality/duration/timing/severity/associated sxs/prior Treatment) Patient is a 74 y.o. female presenting with hip pain.  Hip Pain This is a new problem. The current episode started 12 to 24 hours ago. The problem occurs constantly. The problem has not changed since onset.Pertinent negatives include no chest pain, no abdominal pain, no headaches and no shortness of breath. Exacerbated by: movement of back, anywhere in L leg. Nothing relieves the symptoms. She has tried nothing for the symptoms.    Past Medical History  Diagnosis Date  . Hyperlipidemia   . Hypertension   . Chronic kidney disease   . Glaucoma   . Arthritis   . Dialysis patient   . Diabetes mellitus without complication   . Blind    Past Surgical History  Procedure Laterality Date  . Cholecystectomy     Family History  Problem Relation Age of Onset  . Hypertension Mother   . Hypertension Father    History  Substance Use Topics  . Smoking status: Former Games developer  . Smokeless tobacco: Not on file  . Alcohol Use: No   OB History    No data available     Review of Systems  Respiratory: Negative for shortness of breath.   Cardiovascular: Negative for chest pain.  Gastrointestinal: Negative for abdominal pain.  Neurological: Negative for headaches.  All other systems reviewed and are negative.     Allergies  Cheese; Penicillins; and Tomato  Home Medications   Prior to Admission medications   Medication Sig Start Date End Date Taking? Authorizing Provider  amLODipine (NORVASC) 10 MG tablet Take 10 mg by mouth daily.   Yes Historical Provider, MD  aspirin EC 325 MG EC tablet Take 1 tablet (325 mg total) by mouth daily. 05/21/14  Yes Ripudeep Jenna Luo, MD  atorvastatin (LIPITOR) 20 MG tablet Take 20 mg  by mouth daily.   Yes Historical Provider, MD  brimonidine (ALPHAGAN) 0.15 % ophthalmic solution Place 1 drop into the right eye 3 (three) times daily.    Yes Historical Provider, MD  Dorzolamide HCl-Timolol Mal PF 22.3-6.8 MG/ML SOLN Place 1 drop into the right eye 3 (three) times daily.    Yes Historical Provider, MD  HYDROcodone-acetaminophen (NORCO/VICODIN) 5-325 MG per tablet Take 1 tablet by mouth every 6 (six) hours as needed for moderate pain. 05/21/14  Yes Ripudeep Jenna Luo, MD  insulin detemir (LEVEMIR) 100 UNIT/ML injection Inject 15 Units into the skin daily.   Yes Historical Provider, MD  lanthanum (FOSRENOL) 1000 MG chewable tablet Chew 1,000 mg by mouth 3 (three) times daily with meals.   Yes Historical Provider, MD  latanoprost (XALATAN) 0.005 % ophthalmic solution Place 1 drop into the right eye at bedtime.    Yes Historical Provider, MD  metoprolol succinate (TOPROL-XL) 100 MG 24 hr tablet Take 100 mg by mouth every 12 (twelve) hours. Take with or immediately following a meal.   Yes Historical Provider, MD  Multiple Vitamin (MULTIVITAMIN WITH MINERALS) TABS tablet Take 1 tablet by mouth daily.   Yes Historical Provider, MD  saccharomyces boulardii (FLORASTOR) 250 MG capsule Take 1 capsule (250 mg total) by mouth 2 (two) times daily. 05/21/14  Yes Ripudeep Jenna Luo, MD  cyclobenzaprine (FLEXERIL) 5 MG tablet Take 1 tablet (5 mg total)  by mouth 3 (three) times daily as needed for muscle spasms. 07/16/14   Mirian MoMatthew Gentry, MD  insulin aspart (NOVOLOG) 100 UNIT/ML injection Sliding scale CBG 70 - 120: 0 units CBG 121 - 150: 1 unit,  CBG 151 - 200: 2 units,  CBG 201 - 250: 3 units,  CBG 251 - 300: 5 units,  CBG 301 - 350: 7 units,  CBG 351 - 400: 9 units   CBG > 400: 9 units and notify your MD Patient not taking: Reported on 07/16/2014 05/21/14   Ripudeep Jenna LuoK Rai, MD  MULTIPLE VITAMIN PO Take 1 tablet by mouth daily.     Historical Provider, MD  vancomycin (VANCOCIN) 50 mg/mL oral solution Take 2.5 mLs  (125 mg total) by mouth 4 (four) times daily. X 11 more days Patient not taking: Reported on 07/16/2014 05/21/14   Ripudeep K Rai, MD   BP 132/56 mmHg  Pulse 64  Temp(Src) 98.1 F (36.7 C) (Oral)  Resp 18  Wt 159 lb (72.122 kg)  SpO2 95% Physical Exam  Constitutional: She is oriented to person, place, and time. She appears well-developed and well-nourished.  HENT:  Head: Normocephalic and atraumatic.  Right Ear: External ear normal.  Left Ear: External ear normal.  Eyes: Conjunctivae and EOM are normal. Pupils are equal, round, and reactive to light.  Neck: Normal range of motion. Neck supple.  Cardiovascular: Normal rate, regular rhythm, normal heart sounds and intact distal pulses.   Pulmonary/Chest: Effort normal and breath sounds normal.  Abdominal: Soft. Bowel sounds are normal. There is no tenderness.  Musculoskeletal:       Cervical back: She exhibits tenderness and bony tenderness.       Thoracic back: Normal.       Lumbar back: She exhibits tenderness and bony tenderness. She exhibits normal range of motion.  Neurological: She is alert and oriented to person, place, and time.  Skin: Skin is warm and dry.  Vitals reviewed.   ED Course  Procedures (including critical care time) Labs Review Labs Reviewed  URINALYSIS, ROUTINE W REFLEX MICROSCOPIC    Imaging Review Dg Cervical Spine Complete  07/16/2014   CLINICAL DATA:  Cervicalgia with left-sided radicular symptoms. No history of recent trauma  EXAM: CERVICAL SPINE  4+ VIEWS  COMPARISON:  None.  FINDINGS: Frontal, lateral, open-mouth odontoid, and bilateral oblique views were obtained. There is no apparent fracture. There is minimal anterolisthesis of C3 on C4, felt to be due to underlying spondylosis. No other spondylolisthesis is appreciable. Prevertebral soft tissues and predental space regions are normal. There is moderately severe disc space narrowing at C3-4, C5-6, and C6-7. There is facet hypertrophy at all levels  except for C2-3 bilaterally. There is upper thoracic dextroscoliosis.  IMPRESSION: Multilevel arthropathy. Slight spondylolisthesis at C3-4 is felt to be due to underlying spondylosis. No fracture appear   Electronically Signed   By: Bretta BangWilliam  Woodruff III M.D.   On: 07/16/2014 17:10   Dg Thoracic Spine W/swimmers  07/16/2014   CLINICAL DATA:  74 year old female with severe back pain since this morning, unable to sit or stand. No history of recent injury.  EXAM: THORACIC SPINE - 2 VIEW + SWIMMERS  COMPARISON:  Chest x-ray 06/21/2014.  FINDINGS: No acute displaced fracture or definite compression type fracture in the thoracic spine. Multilevel degenerative disc disease, with multifocal large marginal osteophytes, most evident in the mid thoracic spine. Exaggerated thoracic kyphosis. Atherosclerosis in the thoracic aorta.  IMPRESSION: 1. No acute abnormalities in the thoracic  spine. 2. Multilevel degenerative disc disease and thoracic spondylosis redemonstrated, as above.   Electronically Signed   By: Trudie Reed M.D.   On: 07/16/2014 17:10   Dg Lumbar Spine Complete  07/16/2014   CLINICAL DATA:  74 year old female with severe pain in the neck and back, unable to sit or stand today. No history of recent injury.  EXAM: LUMBAR SPINE - COMPLETE 4+ VIEW  COMPARISON:  CT of the abdomen and pelvis 10/09/2013.  FINDINGS: Five views of the lumbar spine demonstrate no definite acute displaced fractures or compression type fractures. Advanced discogenic changes are again noted at L5-S1, similar to prior CT scan 10/09/2013. 5 mm of anterolisthesis of L4 upon L5 is unchanged. Alignment is otherwise anatomic. Multilevel facet arthropathy. No defects of the pars interarticularis are noted. High density material noted throughout the fecal stream. Surgical clips project over the right upper quadrant of the abdomen, compatible with prior cholecystectomy.  IMPRESSION: 1. No acute radiographic abnormality of the lumbar spine.  2. Severe multilevel degenerative disc disease and lumbar spondylosis redemonstrated, most profound at L5-S1, but similar to prior study from 10/09/2013.   Electronically Signed   By: Trudie Reed M.D.   On: 07/16/2014 17:14     EKG Interpretation None      MDM   Final diagnoses:  Neck pain  Sciatica, left    74 y.o. female with pertinent PMH of ESRD (TRSa), HTN, DM presents with L hip pain, atraumatic.  Physical exam with lumbar tenderness, +straight leg raise.  No neuro dysfunction, sensation intact.  No cauda equina symptoms.  No systemic symptoms or abd pain.  Attempted to obtain UA, however no urine, pt makes little urine at baseline.  Do not feel benefit of ro of UTI outweighs risk of foley given overall clinical presentation which is very clear for MSK origin, likely sciatica.  Discussed return precautions.  DC Home in stable condition  I have reviewed all laboratory and imaging studies if ordered as above  1. Sciatica, left   2. Neck pain         Mirian Mo, MD 07/16/14 [phone]

## 2014-08-11 ENCOUNTER — Ambulatory Visit: Payer: Medicare Other | Admitting: Podiatry

## 2014-08-16 ENCOUNTER — Ambulatory Visit: Payer: Medicare Other | Admitting: Podiatry

## 2014-10-11 ENCOUNTER — Ambulatory Visit: Payer: Medicare Other | Admitting: Podiatry

## 2014-11-30 ENCOUNTER — Encounter (HOSPITAL_COMMUNITY): Payer: Self-pay | Admitting: Emergency Medicine

## 2014-11-30 ENCOUNTER — Emergency Department (HOSPITAL_COMMUNITY)
Admission: EM | Admit: 2014-11-30 | Discharge: 2014-12-01 | Disposition: A | Discharge status: Home / Self Care | Payer: Medicare Other | Attending: Emergency Medicine | Admitting: Emergency Medicine

## 2014-11-30 DIAGNOSIS — Z794 Long term (current) use of insulin: Secondary | ICD-10-CM | POA: Insufficient documentation

## 2014-11-30 DIAGNOSIS — Z992 Dependence on renal dialysis: Secondary | ICD-10-CM | POA: Diagnosis not present

## 2014-11-30 DIAGNOSIS — Z79899 Other long term (current) drug therapy: Secondary | ICD-10-CM | POA: Diagnosis not present

## 2014-11-30 DIAGNOSIS — H540 Blindness, both eyes: Secondary | ICD-10-CM | POA: Diagnosis not present

## 2014-11-30 DIAGNOSIS — Z7982 Long term (current) use of aspirin: Secondary | ICD-10-CM | POA: Insufficient documentation

## 2014-11-30 DIAGNOSIS — Z87891 Personal history of nicotine dependence: Secondary | ICD-10-CM | POA: Insufficient documentation

## 2014-11-30 DIAGNOSIS — N186 End stage renal disease: Secondary | ICD-10-CM | POA: Insufficient documentation

## 2014-11-30 DIAGNOSIS — Z880 Allergy status to penicillin: Secondary | ICD-10-CM | POA: Insufficient documentation

## 2014-11-30 DIAGNOSIS — M1990 Unspecified osteoarthritis, unspecified site: Secondary | ICD-10-CM | POA: Diagnosis not present

## 2014-11-30 DIAGNOSIS — E785 Hyperlipidemia, unspecified: Secondary | ICD-10-CM | POA: Diagnosis not present

## 2014-11-30 DIAGNOSIS — E119 Type 2 diabetes mellitus without complications: Secondary | ICD-10-CM | POA: Insufficient documentation

## 2014-11-30 DIAGNOSIS — E860 Dehydration: Secondary | ICD-10-CM | POA: Diagnosis present

## 2014-11-30 DIAGNOSIS — I120 Hypertensive chronic kidney disease with stage 5 chronic kidney disease or end stage renal disease: Secondary | ICD-10-CM | POA: Diagnosis not present

## 2014-11-30 HISTORY — DX: Type 2 diabetes mellitus without complications: E11.9

## 2014-11-30 NOTE — ED Notes (Signed)
Pt states she was sent here to receive IVF by her PCP  Pt states she has had vomiting and diarrhea   Pt states she has been having abd pain and was seen by her dr today  Pt states she last vomited at about 4pm today and last stool was about an hour ago  Pt states she was given imodium at dialysis today

## 2014-12-01 ENCOUNTER — Encounter (HOSPITAL_COMMUNITY): Payer: Self-pay | Admitting: Emergency Medicine

## 2014-12-01 DIAGNOSIS — I120 Hypertensive chronic kidney disease with stage 5 chronic kidney disease or end stage renal disease: Secondary | ICD-10-CM | POA: Diagnosis not present

## 2014-12-01 MED ORDER — SODIUM CHLORIDE 0.9 % IV BOLUS (SEPSIS): 250.0000 mL | Freq: Once | INTRAVENOUS | Status: DC

## 2014-12-01 NOTE — ED Notes (Signed)
Delay in bloodwork and fluids due to difficulty getting IV access. IV team consult requested. They were also unable to start an IV.

## 2014-12-01 NOTE — ED Notes (Signed)
Pt is waiting for her daughter to come pick her up.

## 2014-12-01 NOTE — ED Provider Notes (Addendum)
CSN: [id number]     Arrival date & time 11/30/14  2114 History  This chart was scribed for Christina Libra, MD by Christina [name], ED Scribe. This patient was seen in room WA24/WA24 and the patient's care was started 2:04 AM.   Chief Complaint  Patient presents with  . Dehydration   The history is provided by the patient and a relative. No language interpreter was used.   HPI Comments: Christina [name] is a 74 y.o. female who has had diarrhea for the past week and was dialyzed yesterday. She states that someone called her after hours telling her she needed to get to an emergency department for IV fluids although the reason is unclear. She had one episode of emesis episode of emesis yesterday afternoon after consuming Nepro nutritional supplement. Denies fevers, recent illness, or recent antibiotic courses. She had an MRI of the abdomen yesterday but the results have not been given to her. She has been taking Imodium for the past week without control of her diarrhea.  Past Medical History  Diagnosis Date  . Hyperlipidemia   . Hypertension   . Chronic kidney disease   . Glaucoma   . Arthritis   . Dialysis patient   . Diabetes mellitus without complication   . Blind    Past Surgical History  Procedure Laterality Date  . Cholecystectomy     Family History  Problem Relation Age of Onset  . Hypertension Mother   . Hypertension Father    Social History  Substance Use Topics  . Smoking status: Former Games developer  . Smokeless tobacco: None  . Alcohol Use: No   OB History    No data available     Review of Systems  All other systems reviewed and are negative.  Allergies  Cheese; Penicillins; and Tomato  Home Medications   Prior to Admission medications   Medication Sig Start Date End Date Taking? Authorizing Provider  amLODipine (NORVASC) 10 MG tablet Take 10 mg by mouth daily.   Yes Historical Provider, MD  aspirin EC 325 MG EC tablet Take 1 tablet (325 mg total) by mouth daily. 05/21/14   Yes Christina Jenna Luo, MD  atorvastatin (LIPITOR) 20 MG tablet Take 20 mg by mouth at bedtime.    Yes Historical Provider, MD  insulin aspart (NOVOLOG) 100 UNIT/ML injection Sliding scale CBG 70 - 120: 0 units CBG 121 - 150: 1 unit,  CBG 151 - 200: 2 units,  CBG 201 - 250: 3 units,  CBG 251 - 300: 5 units,  CBG 301 - 350: 7 units,  CBG 351 - 400: 9 units   CBG > 400: 9 units and notify your MD 05/21/14  Yes Christina K Rai, MD  insulin detemir (LEVEMIR) 100 UNIT/ML injection Inject 15 Units into the skin daily as needed (Blood Sugar above 110).    Yes Historical Provider, MD  metoprolol succinate (TOPROL-XL) 100 MG 24 hr tablet Take 100 mg by mouth every 12 (twelve) hours. Take with or immediately following a meal.   Yes Historical Provider, MD  Multiple Vitamin (MULTIVITAMIN WITH MINERALS) TABS tablet Take 1 tablet by mouth daily.   Yes Historical Provider, MD  cyclobenzaprine (FLEXERIL) 5 MG tablet Take 1 tablet (5 mg total) by mouth 3 (three) times daily as needed for muscle spasms. Patient not taking: Reported on 11/30/2014 07/16/14   Christina Mo, MD  HYDROcodone-acetaminophen (NORCO/VICODIN) 5-325 MG per tablet Take 1 tablet by mouth every 6 (six) hours as needed for moderate pain.  Patient not taking: Reported on 11/30/2014 05/21/14   Christina Jenna Luo, MD  oxyCODONE-acetaminophen (PERCOCET/ROXICET) 5-325 MG per tablet Take 2 tablets by mouth every 4 (four) hours as needed for severe pain. Patient not taking: Reported on 11/30/2014 07/16/14   Christina Mo, MD  saccharomyces boulardii (FLORASTOR) 250 MG capsule Take 1 capsule (250 mg total) by mouth 2 (two) times daily. Patient not taking: Reported on 11/30/2014 05/21/14   Christina Jenna Luo, MD  vancomycin (VANCOCIN) 50 mg/mL oral solution Take 2.5 mLs (125 mg total) by mouth 4 (four) times daily. X 11 more days Patient not taking: Reported on 07/16/2014 05/21/14   Christina K Rai, MD   BP 173/60 mmHg  Pulse 56  Temp(Src) 97.6 F (36.4 C) (Oral)  Resp 16  SpO2  100% Physical Exam  General: Well-developed, well-nourished female in no acute distress; appearance consistent with age of record HENT: normocephalic; atraumatic Eyes: pupils equal, round and non-reactive to light; can see only light bilaterally; extraocular muscles intact Neck: supple Heart: regular rate and rhythm Lungs: clear to auscultation bilaterally Abdomen: soft; nondistended; nontender; no masses or hepatosplenomegaly; bowel sounds present Extremities: No deformity; full range of motion; pulses normal; dialysis fistula in right upper arm with pulse and thrill Neurologic: Awake, alert and oriented; motor function intact in all extremities and symmetric; no facial droop Skin: Warm and dry Psychiatric: Normal mood and affect  ED Course  Procedures   MDM  4:37 AM Despite multiple attempts nursing staff was unable to secure an IV are obtained blood. The patient has been eating and drinking without difficulty. It is unclear why she was sent here for IV fluids when clinically she does not appear to be dehydrated. She will follow-up with dialysis tomorrow.  Christina Libra, MD 12/01/14 4034  Christina Libra, MD 12/01/14 [phone]

## 2015-01-19 ENCOUNTER — Ambulatory Visit: Payer: Medicare Other | Admitting: Podiatry

## 2015-01-26 ENCOUNTER — Encounter: Payer: Self-pay | Admitting: Podiatry

## 2015-01-26 ENCOUNTER — Ambulatory Visit (INDEPENDENT_AMBULATORY_CARE_PROVIDER_SITE_OTHER): Payer: Medicare Other | Admitting: Podiatry

## 2015-01-26 DIAGNOSIS — B351 Tinea unguium: Secondary | ICD-10-CM | POA: Diagnosis not present

## 2015-01-26 DIAGNOSIS — M79676 Pain in unspecified toe(s): Secondary | ICD-10-CM

## 2015-01-26 NOTE — Progress Notes (Signed)
Patient ID: Christina [name], female   DOB: Jul 26, 1940, 74 y.o.   MRN: [id number] Complaint:  Visit Type: Patient returns to my office for continued preventative foot care services. Complaint: Patient states" my nails have grown long and thick and become painful to walk and wear shoes" Patient has been diagnosed with DM with vascular complications.. The patient presents for preventative foot care services. No changes to ROS  Podiatric Exam: Vascular: dorsalis pedis and posterior tibial pulses are not  palpable bilateral. Capillary return is immediate. Cold feet noted.  Sensorium: Diminished  Semmes Weinstein monofilament test. Normal tactile sensation bilaterally. Nail Exam: Pt has thick disfigured discolored nails with subungual debris noted bilateral entire nail hallux through fifth toenails Ulcer Exam: There is no evidence of ulcer or pre-ulcerative changes or infection. Orthopedic Exam: Muscle tone and strength are WNL. No limitations in general ROM. No crepitus or effusions noted. Foot type and digits show no abnormalities. Bony prominences are unremarkable. Skin: No Porokeratosis. No infection or ulcers  Diagnosis:  Onychomycosis, , Pain in right toe, pain in left toes  Treatment & Plan Procedures and Treatment: Consent by patient was obtained for treatment procedures. The patient understood the discussion of treatment and procedures well. All questions were answered thoroughly reviewed. Debridement of mycotic and hypertrophic toenails, 1 through 5 bilateral and clearing of subungual debris. No ulceration, no infection noted.  Return Visit-Office Procedure: Patient instructed to return to the office for a follow up visit 3 months for continued evaluation and treatment.

## 2015-01-27 ENCOUNTER — Encounter (HOSPITAL_COMMUNITY): Payer: Self-pay | Admitting: Emergency Medicine

## 2015-01-27 ENCOUNTER — Observation Stay (HOSPITAL_COMMUNITY)
Admission: EM | Admit: 2015-01-27 | Discharge: 2015-01-30 | Disposition: A | Discharge status: Home / Self Care | Payer: Medicare Other | Attending: Internal Medicine | Admitting: Internal Medicine

## 2015-01-27 DIAGNOSIS — Z91018 Allergy to other foods: Secondary | ICD-10-CM | POA: Insufficient documentation

## 2015-01-27 DIAGNOSIS — H540 Blindness, both eyes: Secondary | ICD-10-CM | POA: Insufficient documentation

## 2015-01-27 DIAGNOSIS — Z993 Dependence on wheelchair: Secondary | ICD-10-CM | POA: Diagnosis not present

## 2015-01-27 DIAGNOSIS — N186 End stage renal disease: Secondary | ICD-10-CM | POA: Diagnosis not present

## 2015-01-27 DIAGNOSIS — Z880 Allergy status to penicillin: Secondary | ICD-10-CM | POA: Diagnosis not present

## 2015-01-27 DIAGNOSIS — R1084 Generalized abdominal pain: Secondary | ICD-10-CM | POA: Insufficient documentation

## 2015-01-27 DIAGNOSIS — R112 Nausea with vomiting, unspecified: Secondary | ICD-10-CM | POA: Diagnosis present

## 2015-01-27 DIAGNOSIS — R627 Adult failure to thrive: Secondary | ICD-10-CM | POA: Diagnosis not present

## 2015-01-27 DIAGNOSIS — Z992 Dependence on renal dialysis: Secondary | ICD-10-CM | POA: Insufficient documentation

## 2015-01-27 DIAGNOSIS — Z91011 Allergy to milk products: Secondary | ICD-10-CM | POA: Diagnosis not present

## 2015-01-27 DIAGNOSIS — A047 Enterocolitis due to Clostridium difficile: Secondary | ICD-10-CM | POA: Diagnosis not present

## 2015-01-27 DIAGNOSIS — R748 Abnormal levels of other serum enzymes: Secondary | ICD-10-CM | POA: Diagnosis not present

## 2015-01-27 DIAGNOSIS — R5381 Other malaise: Secondary | ICD-10-CM | POA: Diagnosis not present

## 2015-01-27 DIAGNOSIS — Z794 Long term (current) use of insulin: Secondary | ICD-10-CM | POA: Diagnosis not present

## 2015-01-27 DIAGNOSIS — E785 Hyperlipidemia, unspecified: Secondary | ICD-10-CM | POA: Insufficient documentation

## 2015-01-27 DIAGNOSIS — Z9119 Patient's noncompliance with other medical treatment and regimen: Secondary | ICD-10-CM | POA: Diagnosis not present

## 2015-01-27 DIAGNOSIS — Z87891 Personal history of nicotine dependence: Secondary | ICD-10-CM | POA: Diagnosis not present

## 2015-01-27 DIAGNOSIS — Z7982 Long term (current) use of aspirin: Secondary | ICD-10-CM | POA: Diagnosis not present

## 2015-01-27 DIAGNOSIS — E1122 Type 2 diabetes mellitus with diabetic chronic kidney disease: Secondary | ICD-10-CM | POA: Diagnosis not present

## 2015-01-27 DIAGNOSIS — H548 Legal blindness, as defined in USA: Secondary | ICD-10-CM | POA: Insufficient documentation

## 2015-01-27 DIAGNOSIS — I120 Hypertensive chronic kidney disease with stage 5 chronic kidney disease or end stage renal disease: Secondary | ICD-10-CM | POA: Insufficient documentation

## 2015-01-27 DIAGNOSIS — A0472 Enterocolitis due to Clostridium difficile, not specified as recurrent: Secondary | ICD-10-CM | POA: Insufficient documentation

## 2015-01-27 DIAGNOSIS — R197 Diarrhea, unspecified: Secondary | ICD-10-CM

## 2015-01-27 LAB — URINALYSIS, ROUTINE W REFLEX MICROSCOPIC
Bilirubin Urine: NEGATIVE
Glucose, UA: NEGATIVE mg/dL
Hgb urine dipstick: NEGATIVE
KETONES UR: NEGATIVE mg/dL
LEUKOCYTES UA: NEGATIVE
NITRITE: NEGATIVE
Protein, ur: >300 mg/dL — AB
Specific Gravity, Urine: 1.016 (ref 1.005–1.030)
UROBILINOGEN UA: 0.2 mg/dL (ref 0.0–1.0)
pH: 8 (ref 5.0–8.0)

## 2015-01-27 LAB — URINE MICROSCOPIC-ADD ON

## 2015-01-27 LAB — CBC
HCT: 39.4 % (ref 36.0–46.0)
Hemoglobin: 12.8 g/dL (ref 12.0–15.0)
MCH: 31 pg (ref 26.0–34.0)
MCHC: 32.5 g/dL (ref 30.0–36.0)
MCV: 95.4 fL (ref 78.0–100.0)
PLATELETS: 223 10*3/uL (ref 150–400)
RBC: 4.13 MIL/uL (ref 3.87–5.11)
RDW: 14 % (ref 11.5–15.5)
WBC: 4.6 10*3/uL (ref 4.0–10.5)

## 2015-01-27 LAB — COMPREHENSIVE METABOLIC PANEL
ALK PHOS: 146 U/L — AB (ref 38–126)
ALT: 24 U/L (ref 14–54)
AST: 38 U/L (ref 15–41)
Albumin: 3.7 g/dL (ref 3.5–5.0)
Anion gap: 10 (ref 5–15)
BUN: 15 mg/dL (ref 6–20)
CALCIUM: 9.4 mg/dL (ref 8.9–10.3)
CO2: 33 mmol/L — ABNORMAL HIGH (ref 22–32)
CREATININE: 3.98 mg/dL — AB (ref 0.44–1.00)
Chloride: 95 mmol/L — ABNORMAL LOW (ref 101–111)
GFR, EST AFRICAN AMERICAN: 12 mL/min — AB (ref >60)
GFR, EST NON AFRICAN AMERICAN: 10 mL/min — AB (ref >60)
Glucose, Bld: 183 mg/dL — ABNORMAL HIGH (ref 65–99)
Potassium: 3.4 mmol/L — ABNORMAL LOW (ref 3.5–5.1)
SODIUM: 138 mmol/L (ref 135–145)
TOTAL PROTEIN: 8.1 g/dL (ref 6.5–8.1)
Total Bilirubin: 0.3 mg/dL (ref 0.3–1.2)

## 2015-01-27 LAB — LIPASE, BLOOD: LIPASE: 80 U/L — AB (ref 22–51)

## 2015-01-27 LAB — I-STAT CG4 LACTIC ACID, ED: LACTIC ACID, VENOUS: 2.84 mmol/L — AB (ref 0.5–2.0)

## 2015-01-27 LAB — I-STAT TROPONIN, ED: TROPONIN I, POC: 0.01 ng/mL (ref 0.00–0.08)

## 2015-01-27 MED ORDER — ONDANSETRON HCL 4 MG/2ML IJ SOLN
4.0000 mg | Freq: Once | INTRAMUSCULAR | Status: AC
  Administered 2015-01-27: 4 mg via INTRAVENOUS
  Filled 2015-01-27: qty 2

## 2015-01-27 MED ORDER — HYDROMORPHONE HCL 1 MG/ML IJ SOLN
1.0000 mg | Freq: Once | INTRAMUSCULAR | Status: AC
  Administered 2015-01-27: 1 mg via INTRAVENOUS
  Filled 2015-01-27: qty 1

## 2015-01-27 MED ORDER — IOHEXOL 300 MG/ML  SOLN
25.0000 mL | INTRAMUSCULAR | Status: AC
  Administered 2015-01-27: 25 mL via ORAL

## 2015-01-27 NOTE — ED Notes (Signed)
Pt presents with multiple complaints. Pt is wheelchair bound at home, family brought patient to ED for evaluation of abdominal pain. Pt reports NV x 6 episodes today. Also c/o ankle pain and neck pain. 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 Hx of cdiff in which abdominal pain "feels different."

## 2015-01-27 NOTE — ED Provider Notes (Signed)
CSN: [id number]     Arrival date & time 01/27/15  1844 History   First MD Initiated Contact with Patient 01/27/15 2052     Chief Complaint  Patient presents with  . Abdominal Pain   Christina [name] is a 74 y.o. 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 difficile colitis who presents from her dialysis center at the direction of her PCP for workup and management of nausea, vomiting, diarrhea, and significant abdominal pain. The patient reports that she has had worsening symptoms for the last week including a worse episodes of nausea and vomiting today. The patient says that her abdominal pain is "100 out of 10" in severity. The patient ascribes the pain is located all over her abdomen, constant, and worsening. The patient says that she still makes some urine and was having some dysuria. The patient says she has not had any blood in her emesis. The patient denied fevers but did report subjective chills today.    (Consider location/radiation/quality/duration/timing/severity/associated sxs/prior Treatment) Patient is a 74 y.o. female presenting with abdominal pain. The history is provided by the patient and medical records. No language interpreter was used.  Abdominal Pain Pain location:  Generalized Pain quality: aching, cramping, pressure and sharp   Pain radiates to:  Does not radiate Pain severity:  Severe Onset quality:  Gradual Duration:  1 week Timing:  Constant Progression:  Waxing and waning Chronicity:  New Context: not trauma   Relieved by:  Nothing Worsened by:  Nothing tried Associated symptoms: chest pain, chills, diarrhea, dysuria, nausea and vomiting   Associated symptoms: no constipation, no cough, no fever and no shortness of breath   Diarrhea:    Quality:  Watery   Severity:  Moderate   Duration:  1 week   Timing:  Constant Dysuria:    Severity:   Moderate Nausea:    Severity:  Severe   Onset quality:  Gradual   Timing:  Intermittent   Progression:  Waxing and waning Vomiting:    Quality:  Stomach contents   Number of occurrences:  Numerous   Severity:  Moderate   Past Medical History  Diagnosis Date  . Hyperlipidemia   . Hypertension   . Chronic kidney disease   . Glaucoma   . Arthritis   . Dialysis patient (HCC)   . Blind   . Diabetes mellitus [facility])    Past Surgical History  Procedure Laterality Date  . Cholecystectomy     Family History  Problem Relation Age of Onset  . Hypertension Mother   . Hypertension Father    Social History  Substance Use Topics  . Smoking status: Former Games developer  . Smokeless tobacco: None  . Alcohol Use: No   OB History    No data available     Review of Systems  Constitutional: Positive for chills. Negative for fever and diaphoresis.  HENT: Negative for congestion and rhinorrhea.   Eyes: Negative for visual disturbance.  Respiratory: Negative for cough, chest tightness, shortness of breath, wheezing and stridor.   Cardiovascular: Positive for chest pain. Negative for palpitations and leg swelling.  Gastrointestinal: Positive for nausea, vomiting, abdominal pain and diarrhea. Negative for constipation.  Genitourinary: Positive for dysuria. Negative for flank pain.  Musculoskeletal: Negative for back pain, neck pain and neck stiffness.  Skin: Negative for rash and wound.  Neurological: Negative for headaches.  Psychiatric/Behavioral: Negative for confusion and agitation.  All other systems reviewed and are negative.     Allergies  Cheese; Penicillins; and Tomato  Home Medications   Prior to Admission medications   Medication Sig Start Date End Date Taking? Authorizing Provider  amLODipine (NORVASC) 10 MG tablet Take 10 mg by mouth daily.    Historical Provider, MD  aspirin EC 325 MG EC tablet Take 1 tablet (325 mg total) by mouth daily. 05/21/14   Ripudeep Jenna Luo, MD   atorvastatin (LIPITOR) 20 MG tablet Take 20 mg by mouth at bedtime.     Historical Provider, MD  cyclobenzaprine (FLEXERIL) 5 MG tablet Take 1 tablet (5 mg total) by mouth 3 (three) times daily as needed for muscle spasms. Patient not taking: Reported on 11/30/2014 07/16/14   Mirian Mo, MD  HYDROcodone-acetaminophen (NORCO/VICODIN) 5-325 MG per tablet Take 1 tablet by mouth every 6 (six) hours as needed for moderate pain. Patient not taking: Reported on 11/30/2014 05/21/14   Ripudeep Jenna Luo, MD  insulin aspart (NOVOLOG) 100 UNIT/ML injection Sliding scale CBG 70 - 120: 0 units CBG 121 - 150: 1 unit,  CBG 151 - 200: 2 units,  CBG 201 - 250: 3 units,  CBG 251 - 300: 5 units,  CBG 301 - 350: 7 units,  CBG 351 - 400: 9 units   CBG > 400: 9 units and notify your MD 05/21/14   Ripudeep Jenna Luo, MD  insulin detemir (LEVEMIR) 100 UNIT/ML injection Inject 15 Units into the skin daily as needed (Blood Sugar above 110).     Historical Provider, MD  metoprolol succinate (TOPROL-XL) 100 MG 24 hr tablet Take 100 mg by mouth every 12 (twelve) hours. Take with or immediately following a meal.    Historical Provider, MD  Multiple Vitamin (MULTIVITAMIN WITH MINERALS) TABS tablet Take 1 tablet by mouth daily.    Historical Provider, MD  oxyCODONE-acetaminophen (PERCOCET/ROXICET) 5-325 MG per tablet Take 2 tablets by mouth every 4 (four) hours as needed for severe pain. Patient not taking: Reported on 11/30/2014 07/16/14   Mirian Mo, MD  saccharomyces boulardii (FLORASTOR) 250 MG capsule Take 1 capsule (250 mg total) by mouth 2 (two) times daily. Patient not taking: Reported on 11/30/2014 05/21/14   Ripudeep Jenna Luo, MD  vancomycin (VANCOCIN) 50 mg/mL oral solution Take 2.5 mLs (125 mg total) by mouth 4 (four) times daily. X 11 more days Patient not taking: Reported on 07/16/2014 05/21/14   Ripudeep K Rai, MD   BP 145/80 mmHg  Pulse 70  Temp(Src) 98.3 F (36.8 C) (Oral)  Resp 22  SpO2 99% Physical Exam  Constitutional:  She is oriented to person, place, and time. She appears well-developed and well-nourished. No distress.  HENT:  Head: Normocephalic.  Mouth/Throat: No oropharyngeal exudate.  Eyes: Conjunctivae are normal. Pupils are equal, round, and reactive to light.  Neck: Normal range of motion.  Cardiovascular: Normal rate.   No murmur heard. Pulmonary/Chest: Effort normal and breath sounds normal. No stridor. No respiratory distress. She has no wheezes. She exhibits no tenderness.  Abdominal: Soft. Normal appearance. She exhibits no distension. There is generalized tenderness. There is no rigidity, no rebound, no guarding and no CVA tenderness.  Musculoskeletal: She exhibits no tenderness.  Neurological: She is alert and oriented to person, place, and time. She exhibits normal muscle tone.  Skin: She is not diaphoretic. No erythema.  Psychiatric: She has a normal mood and affect.  Nursing note and vitals reviewed.   ED Course  Procedures (including critical care  time) Labs Review Labs Reviewed  LIPASE, BLOOD - Abnormal; Notable for the following:    Lipase 80 (*)    All other components within normal limits  COMPREHENSIVE METABOLIC PANEL - Abnormal; Notable for the following:    Potassium 3.4 (*)    Chloride 95 (*)    CO2 33 (*)    Glucose, Bld 183 (*)    Creatinine, Ser 3.98 (*)    Alkaline Phosphatase 146 (*)    GFR calc non Af Amer 10 (*)    GFR calc Af Amer 12 (*)    All other components within normal limits  URINALYSIS, ROUTINE W REFLEX MICROSCOPIC (NOT AT [facility]) - Abnormal; Notable for the following:    APPearance CLOUDY (*)    Protein, ur >300 (*)    All other components within normal limits  I-STAT CG4 LACTIC ACID, ED - Abnormal; Notable for the following:    Lactic Acid, Venous 2.84 (*)    All other components within normal limits  CBC  URINE MICROSCOPIC-ADD ON  BLOOD GAS, ARTERIAL  I-STAT TROPOININ, ED  I-STAT CG4 LACTIC ACID, ED    Imaging Review Ct Abdomen Pelvis Wo  Contrast  01/28/2015   CLINICAL DATA:  Generalized abdominal pain for 2 weeks. Nausea and vomiting today.  EXAM: CT ABDOMEN AND PELVIS WITHOUT CONTRAST  TECHNIQUE: Multidetector CT imaging of the abdomen and pelvis was performed following the standard protocol without IV contrast.  COMPARISON:  12/17/2014  FINDINGS: There is cholecystectomy. The liver and bile ducts are grossly unremarkable on this unenhanced scan. There are unremarkable unenhanced appearances of the pancreas, spleen, adrenals and kidneys with the exception of a few nonobstructing collecting system calculi. Ureters and urinary bladder appear unremarkable.  The appendix is normal. Remainder of the bowel is unremarkable. The mural thickening and colitic changes observed on 12/17/2014 have resolved. Uterus and ovaries are remarkable only for a small unchanged fibroid on the left. Abdominal aorta is normal in caliber with mild atherosclerotic calcification.  Erosive changes persist about the endplates of L5-S1, and discitis cannot be excluded. Mild spondylolisthesis is present at L4-5 and L5-S1.  IMPRESSION: 1. Resolved colonic mural thickening and inflammation since 12/17/2014. 2. No acute findings are evident in the abdomen or pelvis. 3. Nephrolithiasis 4. Unchanged irregularity and erosions of the L5-S1 endplates, raising the question of discitis. Lumbar MRI with contrast would be helpful for characterization. 5. Uterine fibroid measuring a little over 1 cm.   Electronically Signed   By: Ellery Plunk M.D.   On: 01/28/2015 00:46   I have personally reviewed and evaluated these images and lab results as part of my medical decision-making.   EKG Interpretation   Date/Time:  Thursday January 27 2015 21:48:07 EDT Ventricular Rate:  63 PR Interval:  166 QRS Duration: 97 QT Interval:  468 QTC Calculation: 479 R Axis:   -8 Text Interpretation:  Sinus rhythm Left ventricular hypertrophy Confirmed  by Lincoln Brigham [phone]) on 01/27/2015 9:50:05  PM      MDM   Christina [name] is a 74 y.o. 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 difficile colitis who presents from her dialysis center at the direction of her PCP for workup and management of nausea, vomiting, diarrhea, and significant abdominal pain. On arrival, the patient appeared in significant discomfort with severe abdominal pain, nausea, and report of needing to have a bowel movement. The patient was  given pain medicine and nausea medicine and had screening laboratory testing and imaging testing performed. The patient's CT scan returned without any acute cause of her abdominal pain. The patient reported that the Dilaudid significantly helped her abdominal pain however, the patient was very concerned at the possibility of discharge given her persistent symptoms. The patient said her nausea slightly improved with Zofran.   The patient's laboratory testing results are seen above. The patient's lactic acid was elevated at 2.84. Given the patient's history of end-stage renal disease on dialysis, the patient did not have fluids provided at this time. The patient's initial troponin was negative. The patient's urinalysis showed an elevated protein but there is no evidence of acute infection. The patient's lipase was elevated 80. The patient's CMP revealed elevated CO2 of 33, elevated creatinine of 3.98, and elevated alkaline phosphatase of 148. The patient's GFR was significantly low at 10. The patient's CBC did not show leukocytosis or anemia fortunately.  Given the patient's significant symptoms on arrival, her elevated CO2, and her elevated lactic acid, decision was made to admit the patient for symptomatic management and further workup of her electrolyte abnormalities. The patient is not sure why she is having her symptoms and imaging did not reveal definitive cause at this time.  The  patient will be admitted under observation to the hospitalist service. Anticipate discharge tomorrow with improvement in her symptoms and after securing a follow-up plan.  Patient was seen with Dr. Madilyn Hook, emergency medicine attending.   Final diagnoses:  Nausea vomiting and diarrhea       Theda Belfast, MD 01/28/15 8119  Tilden Fossa, MD 01/29/15 1416

## 2015-01-27 NOTE — ED Notes (Signed)
MD at bedside for US IV

## 2015-01-27 NOTE — ED Notes (Signed)
C/o generalized abd pain x 2 weeks with nausea, vomiting, and diarrhea.  Reports vomited x 6 today.  Also reports frequent urination.

## 2015-01-28 ENCOUNTER — Encounter (HOSPITAL_COMMUNITY): Payer: Self-pay

## 2015-01-28 ENCOUNTER — Emergency Department (HOSPITAL_COMMUNITY): Payer: Medicare Other

## 2015-01-28 DIAGNOSIS — N186 End stage renal disease: Secondary | ICD-10-CM | POA: Insufficient documentation

## 2015-01-28 DIAGNOSIS — A047 Enterocolitis due to Clostridium difficile: Secondary | ICD-10-CM | POA: Diagnosis not present

## 2015-01-28 DIAGNOSIS — R197 Diarrhea, unspecified: Secondary | ICD-10-CM | POA: Diagnosis not present

## 2015-01-28 DIAGNOSIS — Z992 Dependence on renal dialysis: Secondary | ICD-10-CM

## 2015-01-28 DIAGNOSIS — A0472 Enterocolitis due to Clostridium difficile, not specified as recurrent: Secondary | ICD-10-CM | POA: Insufficient documentation

## 2015-01-28 DIAGNOSIS — R112 Nausea with vomiting, unspecified: Secondary | ICD-10-CM | POA: Diagnosis present

## 2015-01-28 DIAGNOSIS — H548 Legal blindness, as defined in USA: Secondary | ICD-10-CM | POA: Insufficient documentation

## 2015-01-28 DIAGNOSIS — R627 Adult failure to thrive: Secondary | ICD-10-CM | POA: Insufficient documentation

## 2015-01-28 LAB — LIPASE, BLOOD: Lipase: 94 U/L — ABNORMAL HIGH (ref 22–51)

## 2015-01-28 LAB — BASIC METABOLIC PANEL
Anion gap: 12 (ref 5–15)
BUN: 21 mg/dL — AB (ref 6–20)
CALCIUM: 9.1 mg/dL (ref 8.9–10.3)
CHLORIDE: 94 mmol/L — AB (ref 101–111)
CO2: 30 mmol/L (ref 22–32)
CREATININE: 4.65 mg/dL — AB (ref 0.44–1.00)
GFR calc non Af Amer: 9 mL/min — ABNORMAL LOW (ref >60)
GFR, EST AFRICAN AMERICAN: 10 mL/min — AB (ref >60)
Glucose, Bld: 137 mg/dL — ABNORMAL HIGH (ref 65–99)
Potassium: 3.7 mmol/L (ref 3.5–5.1)
SODIUM: 136 mmol/L (ref 135–145)

## 2015-01-28 LAB — GLUCOSE, CAPILLARY
GLUCOSE-CAPILLARY: 106 mg/dL — AB (ref 65–99)
GLUCOSE-CAPILLARY: 187 mg/dL — AB (ref 65–99)
Glucose-Capillary: 67 mg/dL (ref 65–99)
Glucose-Capillary: 97 mg/dL (ref 65–99)
Glucose-Capillary: 126 mg/dL — ABNORMAL HIGH (ref 65–99)
Glucose-Capillary: 134 mg/dL — ABNORMAL HIGH (ref 65–99)
Glucose-Capillary: 84 mg/dL (ref 65–99)

## 2015-01-28 LAB — C DIFFICILE QUICK SCREEN W PCR REFLEX
C Diff antigen: POSITIVE — AB
C Diff toxin: NEGATIVE

## 2015-01-28 MED ORDER — AMLODIPINE BESYLATE 10 MG PO TABS
10.0000 mg | ORAL_TABLET | Freq: Every day | ORAL | Status: DC
Start: 2015-01-28 — End: 2015-01-28
  Administered 2015-01-28: 10 mg via ORAL
  Filled 2015-01-28: qty 1

## 2015-01-28 MED ORDER — RENA-VITE PO TABS
1.0000 | ORAL_TABLET | Freq: Every day | ORAL | Status: DC
  Administered 2015-01-28 – 2015-01-29 (×2): 1 via ORAL
  Filled 2015-01-28 (×2): qty 1

## 2015-01-28 MED ORDER — ONDANSETRON HCL 4 MG/2ML IJ SOLN: 4.0000 mg | Freq: Four times a day (QID) | INTRAMUSCULAR | Status: DC | PRN

## 2015-01-28 MED ORDER — INSULIN ASPART 100 UNIT/ML ~~LOC~~ SOLN
0.0000 [IU] | SUBCUTANEOUS | Status: DC
  Administered 2015-01-28: 2 [IU] via SUBCUTANEOUS
  Administered 2015-01-28 – 2015-01-29 (×2): 1 [IU] via SUBCUTANEOUS
  Administered 2015-01-30: 2 [IU] via SUBCUTANEOUS

## 2015-01-28 MED ORDER — DOXERCALCIFEROL 4 MCG/2ML IV SOLN
6.0000 ug | INTRAVENOUS | Status: DC
  Administered 2015-01-29: 6 ug via INTRAVENOUS
  Filled 2015-01-28: qty 4

## 2015-01-28 MED ORDER — METOPROLOL SUCCINATE ER 100 MG PO TB24
100.0000 mg | ORAL_TABLET | Freq: Two times a day (BID) | ORAL | Status: DC
  Administered 2015-01-28: 100 mg via ORAL
  Filled 2015-01-28: qty 1

## 2015-01-28 MED ORDER — ADULT MULTIVITAMIN W/MINERALS CH
1.0000 | ORAL_TABLET | Freq: Every day | ORAL | Status: DC
  Administered 2015-01-28: 1 via ORAL
  Filled 2015-01-28: qty 1

## 2015-01-28 MED ORDER — METOPROLOL TARTRATE 100 MG PO TABS
100.0000 mg | ORAL_TABLET | Freq: Two times a day (BID) | ORAL | Status: DC
  Administered 2015-01-28 – 2015-01-30 (×3): 100 mg via ORAL
  Filled 2015-01-28 (×4): qty 1

## 2015-01-28 MED ORDER — VANCOMYCIN 50 MG/ML ORAL SOLUTION
125.0000 mg | Freq: Four times a day (QID) | ORAL | Status: DC
  Administered 2015-01-28 – 2015-01-30 (×8): 125 mg via ORAL
  Filled 2015-01-28 (×12): qty 2.5

## 2015-01-28 MED ORDER — ASPIRIN EC 325 MG PO TBEC
325.0000 mg | DELAYED_RELEASE_TABLET | Freq: Every day | ORAL | Status: DC
  Administered 2015-01-28 – 2015-01-30 (×3): 325 mg via ORAL
  Filled 2015-01-28 (×3): qty 1

## 2015-01-28 MED ORDER — AMLODIPINE BESYLATE 10 MG PO TABS
10.0000 mg | ORAL_TABLET | Freq: Every day | ORAL | Status: DC
  Administered 2015-01-29: 10 mg via ORAL
  Filled 2015-01-28 (×2): qty 1

## 2015-01-28 MED ORDER — HEPARIN SODIUM (PORCINE) 5000 UNIT/ML IJ SOLN
5000.0000 [IU] | Freq: Three times a day (TID) | INTRAMUSCULAR | Status: DC
  Administered 2015-01-28 – 2015-01-29 (×3): 5000 [IU] via SUBCUTANEOUS
  Filled 2015-01-28 (×2): qty 1

## 2015-01-28 MED ORDER — ATORVASTATIN CALCIUM 20 MG PO TABS
20.0000 mg | ORAL_TABLET | Freq: Every day | ORAL | Status: DC
  Administered 2015-01-28 – 2015-01-29 (×2): 20 mg via ORAL
  Filled 2015-01-28 (×2): qty 1

## 2015-01-28 NOTE — ED Notes (Signed)
Pt taken to CT.

## 2015-01-28 NOTE — Progress Notes (Signed)
Pt refused ABG.states that she dosent have any where for [place] to stick her.

## 2015-01-28 NOTE — Progress Notes (Signed)
Hypoglycemic Event  CBG: 67  Treatment: 15 GM carbohydrate snack  Symptoms: None  Follow-up CBG: Time:0912 CBG Result:97  Possible Reasons for Event: Unknown  Comments/MD notified:Yes    Christina [name] L

## 2015-01-28 NOTE — Evaluation (Signed)
Physical Therapy Evaluation Patient Details Name: Christina [name] MRN: [id number] DOB: February 25, 1941 Today's Date: 01/28/2015   History of Present Illness  Pt is a 74 y/o female with a history of c-diff, blindness, ESRD on HD 3x/week, and HTN. Pt presents from her dialysis center with N/V/D.   Clinical Impression  Pt admitted with above diagnosis. Pt currently with functional limitations due to the deficits listed below (see PT Problem List). At the time of PT eval mobility assessment was limited. Pt was very emotional and tearful with requests to eat and use bathroom, however when set up to do so pt refused. Attempted to assist pt to [facility] for ~10 minutes and whenever we were about to initiate transfer, pt refused to participate.  Pt unable to provide details home environment, however was able to describe how she gets to/from her wheelchair at home. It appears that family members either pick her up or use her arms to lift and move her. Pt states she cannot feed herself lunch, however per RN pt was able to feed herself breakfast. RN offered to assist pt with lunch and she refused. It is unclear whether pt has 24 hour assist at home, but feel this pt would benefit from a higher level of care at d/c. Pt will benefit from skilled PT to increase their independence and safety with mobility to allow discharge to the venue listed below.       Follow Up Recommendations SNF;Supervision/Assistance - 24 hour    Equipment Recommendations  None recommended by PT    Recommendations for Other Services       Precautions / Restrictions Precautions Precautions: Fall Precaution Comments: Pt is apparently wheelchair bound at home Restrictions Weight Bearing Restrictions: No      Mobility  Bed Mobility Overal bed mobility: Needs Assistance Bed Mobility: Supine to Sit;Sit to Supine     Supine to sit: Max assist Sit to supine: Max assist;+2 for physical assistance   General bed mobility comments: Bed pad  used for assistance for scooting. Pt required assist for all aspects of transitioning to/from EOB. When asked to try and move some on her own, pt began crying and loudsly states "I can't, that's what I'm trying to tell you" each time.  Transfers                 General transfer comment: BSC set up as pt crying and asking to use the bathroom. After significant time spent trying to convince pt to participate in transfer to [facility], she states she doesn't need to go. Pt was returned to supine.   Ambulation/Gait                Stairs            Wheelchair Mobility    Modified Rankin (Stroke Patients Only)       Balance Overall balance assessment: Needs assistance Sitting-balance support: Feet supported;No upper extremity supported Sitting balance-Leahy Scale: Poor Sitting balance - Comments: Requires UE support Postural control: Posterior lean;Right lateral lean;Left lateral lean                                   Pertinent Vitals/Pain Pain Assessment: Faces Faces Pain Scale: Hurts even more Pain Location: Abdomen Pain Descriptors / Indicators: Discomfort Pain Intervention(s): Limited activity within patient's tolerance;Monitored during session;Repositioned    Home Living Family/patient expects to be discharged to:: Skilled nursing facility  Additional Comments: Pt is unable to provide details of home environment.     Prior Function Level of Independence: Needs assistance   Gait / Transfers Assistance Needed: Pt reports that family members assist her to/from her wheelchair. Pt states her son "throws her over his shoulder" and other family members pick her up by her arms and place her in/out of wheelchair to bed or BSC.   ADL's / Homemaking Assistance Needed: Pt reports that family feeds her and she cannot feed herself here in the hospital.         Hand Dominance   Dominant Hand: Right    Extremity/Trunk Assessment    Upper Extremity Assessment: Generalized weakness           Lower Extremity Assessment: Generalized weakness (Pt would not participate in strength testing)      Cervical / Trunk Assessment: Other exceptions  Communication   Communication: No difficulties  Cognition Arousal/Alertness: Awake/alert Behavior During Therapy: WFL for tasks assessed/performed Overall Cognitive Status: No family/caregiver present to determine baseline cognitive functioning (Likely some cognitive deficits at baseline)                      General Comments      Exercises        Assessment/Plan    PT Assessment Patient needs continued PT services  PT Diagnosis Generalized weakness   PT Problem List Decreased strength;Decreased range of motion;Decreased activity tolerance;Decreased balance;Decreased mobility;Decreased knowledge of use of DME;Decreased safety awareness;Decreased knowledge of precautions  PT Treatment Interventions DME instruction;Gait training;Functional mobility training;Therapeutic activities;Therapeutic exercise;Neuromuscular re-education;Patient/family education;Wheelchair mobility training   PT Goals (Current goals can be found in the Care Plan section) Acute Rehab PT Goals Patient Stated Goal: Pt did not state goals at this time PT Goal Formulation: Patient unable to participate in goal setting Time For Goal Achievement: 02/11/15 Potential to Achieve Goals: Fair    Frequency Min 1X/week   Barriers to discharge        Co-evaluation               End of Session   Activity Tolerance: Patient limited by fatigue Patient left: in bed;with call bell/phone within reach;with bed alarm set Nurse Communication: Mobility status    Functional Assessment Tool Used: Clinical judgement Functional Limitation: Mobility: Walking and moving around Mobility: Walking and Moving Around Current Status (Z6109): At least 60 percent but less than 80 percent impaired, limited or  restricted Mobility: Walking and Moving Around Goal Status [phone]): At least 60 percent but less than 80 percent impaired, limited or restricted    Time: 0981-1914 PT Time Calculation (min) (ACUTE ONLY): 32 min   Charges:   PT Evaluation $Initial PT Evaluation Tier I: 1 Procedure PT Treatments $Therapeutic Activity: 8-22 mins   PT G Codes:   PT G-Codes **NOT FOR INPATIENT CLASS** Functional Assessment Tool Used: Clinical judgement Functional Limitation: Mobility: Walking and moving around Mobility: Walking and Moving Around Current Status (N8295): At least 60 percent but less than 80 percent impaired, limited or restricted Mobility: Walking and Moving Around Goal Status [phone]): At least 60 percent but less than 80 percent impaired, limited or restricted    Christina [name], [place] 01/28/2015, 3:23 PM   Conni Slipper, PT, DPT Acute Rehabilitation Services Pager: [phone]

## 2015-01-28 NOTE — Progress Notes (Signed)
PROGRESS NOTE  Christina [name] ZHY:[id number] DOB: [birth year] DOA: 01/27/2015 PCP: Mia Creek, MD  HPI/Recap of past 24 hours:  C/o diffuse ab pain, n/v/d.  Assessment/Plan: Active Problems:   Nausea vomiting and diarrhea  Diffuse ab pain, n/v/d, cdiff antigen positive toxin negative, CT ab showed improvement compare to the one from 11/2014, but due to significant symptom will treat with oral vanc. Patient reported has an colonoscopy scheduled on 10/29.  Lactic acidosis: from c diff? Trend.  Elevated lipase in ESRD patient, not sure significance, Ct ab did not show pancreatitis, currently patient on clears due to ongoing n/v/d and ab pain.  ESRD on HD TTS, nephrology consulted for HD support.  HTN/HLD: stable, continue home meds.  FTT: per patient reported she is total care at baseline due to legally blindness and generalized weakness, PT/OT    Code Status: full  Family Communication: patient   Disposition Plan: pending   Consultants:  nephrology  Procedures:  HD TTS  Antibiotics:  Oral vanc   Objective: BP 158/72 mmHg  Pulse 66  Temp(Src) 98.8 F (37.1 C) (Oral)  Resp 16  Ht  (1.702 m)  Wt 141 lb 8 oz (64.184 kg)  BMI 22.16 kg/m2  SpO2 100%  Intake/Output Summary (Last 24 hours) at 01/28/15 1833 Last data filed at 01/28/15 2952  Gross per 24 hour  Intake    240 ml  Output      0 ml  Net    240 ml   Filed Weights   01/28/15 0400  Weight: 141 lb 8 oz (64.184 kg)    Exam:   General:  NAD  Cardiovascular: RRR  Respiratory: CTABL  Abdomen: diffuse tenderness, + guarding, no rebound, not distended,  positive BS  Musculoskeletal: No Edema, bilateral lower extremity weakness, not able to lift against gravity  Neuro: aaox3, legally blind.  Data Reviewed: Basic Metabolic Panel:  Recent Labs Lab 01/27/15 1947 01/28/15 0520  NA 138 136  K 3.4* 3.7  CL 95* 94*  CO2 33* 30  GLUCOSE 183* 137*  BUN 15 21*  CREATININE 3.98*  4.65*  CALCIUM 9.4 9.1   Liver Function Tests:  Recent Labs Lab 01/27/15 1947  AST 38  ALT 24  ALKPHOS 146*  BILITOT 0.3  PROT 8.1  ALBUMIN 3.7    Recent Labs Lab 01/27/15 1947 01/28/15 0520  LIPASE 80* 94*   No results for input(s): AMMONIA in the last 168 hours. CBC:  Recent Labs Lab 01/27/15 1947  WBC 4.6  HGB 12.8  HCT 39.4  MCV 95.4  PLT 223   Cardiac Enzymes:   No results for input(s): CKTOTAL, CKMB, CKMBINDEX, TROPONINI in the last 168 hours. BNP (last 3 results) No results for input(s): BNP in the last 8760 hours.  ProBNP (last 3 results) No results for input(s): PROBNP in the last 8760 hours.  CBG:  Recent Labs Lab 01/28/15 0345 01/28/15 0805 01/28/15 0912 01/28/15 1151 01/28/15 1701  GLUCAP 187* 67 97 126* 134*    Recent Results (from the past 240 hour(s))  C difficile quick scan w PCR reflex     Status: Abnormal   Collection Time: 01/28/15 10:26 AM  Result Value Ref Range Status   C Diff antigen POSITIVE (A) NEGATIVE Final   C Diff toxin NEGATIVE NEGATIVE Final   C Diff interpretation   Final    C. difficile present, but toxin not detected. This indicates colonization. In most cases, this does not require treatment. If patient  has signs and symptoms consistent with colitis, consider treatment. Requires ENTERIC precautions.     Studies: Ct Abdomen Pelvis Wo Contrast  01/28/2015   CLINICAL DATA:  Generalized abdominal pain for 2 weeks. Nausea and vomiting today.  EXAM: CT ABDOMEN AND PELVIS WITHOUT CONTRAST  TECHNIQUE: Multidetector CT imaging of the abdomen and pelvis was performed following the standard protocol without IV contrast.  COMPARISON:  12/17/2014  FINDINGS: There is cholecystectomy. The liver and bile ducts are grossly unremarkable on this unenhanced scan. There are unremarkable unenhanced appearances of the pancreas, spleen, adrenals and kidneys with the exception of a few nonobstructing collecting system calculi. Ureters and  urinary bladder appear unremarkable.  The appendix is normal. Remainder of the bowel is unremarkable. The mural thickening and colitic changes observed on 12/17/2014 have resolved. Uterus and ovaries are remarkable only for a small unchanged fibroid on the left. Abdominal aorta is normal in caliber with mild atherosclerotic calcification.  Erosive changes persist about the endplates of L5-S1, and discitis cannot be excluded. Mild spondylolisthesis is present at L4-5 and L5-S1.  IMPRESSION: 1. Resolved colonic mural thickening and inflammation since 12/17/2014. 2. No acute findings are evident in the abdomen or pelvis. 3. Nephrolithiasis 4. Unchanged irregularity and erosions of the L5-S1 endplates, raising the question of discitis. Lumbar MRI with contrast would be helpful for characterization. 5. Uterine fibroid measuring a little over 1 cm.   Electronically Signed   By: Ellery Plunk M.D.   On: 01/28/2015 00:46    Scheduled Meds: . amLODipine  10 mg Oral Daily  . aspirin EC  325 mg Oral Daily  . atorvastatin  20 mg Oral QHS  . heparin  5,000 Units Subcutaneous 3 times per day  . insulin aspart  0-9 Units Subcutaneous 6 times per day  . metoprolol tartrate  100 mg Oral BID  . multivitamin with minerals  1 tablet Oral Daily  . vancomycin  125 mg Oral QID    Continuous Infusions:    Time spent: from 4pm to 4:35pm  [name] MD, PhD  [facility] Pager [phone]. If 7PM-7AM, please contact night-coverage at [email or web address], password [email or web address] 01/28/2015, 6:33 PM

## 2015-01-28 NOTE — H&P (Addendum)
[facility] History and Physical  Christina [name] ZOX:[id number] DOB: [birth year] DOA: 01/27/2015  Referring physician: EDP PCP: Mia Creek, MD   Chief Complaint: Abdominal pain   HPI: Christina [name] is a 74 y.o. female with ESRD dialysis TTS, dialyzed earlier today.  Patient presents to ED with c/o N/V/D and abdominal pain.  Symptoms have been worsening for the past week.  Pain is located all over her abdomen, constant, worsening.  No fever.  Pain is severe, nothing makes it better or worse.  States she was sent in from dialysis center.  Review of Systems: Systems reviewed.  As above, otherwise negative  Past Medical History  Diagnosis Date  . Hyperlipidemia   . Hypertension   . Chronic kidney disease   . Glaucoma   . Arthritis   . Dialysis patient (HCC)   . Blind   . Diabetes mellitus [facility])    Past Surgical History  Procedure Laterality Date  . Cholecystectomy     Social History:  reports that she has quit smoking. She does not have any smokeless tobacco history on file. She reports that she does not drink alcohol or use illicit drugs.  Allergies  Allergen Reactions  . Cheese Diarrhea    Pt states due to being on dialysis.  [name] Penicillins Hives and Itching    faintng  . Tomato     Pt states due to being on dialysis.    Family History  Problem Relation Age of Onset  . Hypertension Mother   . Hypertension Father      Prior to Admission medications   Medication Sig Start Date End Date Taking? Authorizing Provider  amLODipine (NORVASC) 10 MG tablet Take 10 mg by mouth daily.    Historical Provider, MD  aspirin EC 325 MG EC tablet Take 1 tablet (325 mg total) by mouth daily. 05/21/14   Ripudeep Jenna Luo, MD  atorvastatin (LIPITOR) 20 MG tablet Take 20 mg by mouth at bedtime.     Historical Provider, MD  insulin aspart (NOVOLOG) 100 UNIT/ML injection Sliding scale CBG 70 - 120: 0 units CBG 121 - 150: 1 unit,  CBG 151 - 200: 2 units,  CBG 201 - 250: 3 units,  CBG 251 -  300: 5 units,  CBG 301 - 350: 7 units,  CBG 351 - 400: 9 units   CBG > 400: 9 units and notify your MD 05/21/14   Ripudeep Jenna Luo, MD  insulin detemir (LEVEMIR) 100 UNIT/ML injection Inject 15 Units into the skin daily as needed (Blood Sugar above 110).     Historical Provider, MD  metoprolol succinate (TOPROL-XL) 100 MG 24 hr tablet Take 100 mg by mouth every 12 (twelve) hours. Take with or immediately following a meal.    Historical Provider, MD  Multiple Vitamin (MULTIVITAMIN WITH MINERALS) TABS tablet Take 1 tablet by mouth daily.    Historical Provider, MD   Physical Exam: Filed Vitals:   01/28/15 0129  BP: 151/103  Pulse: 65  Temp:   Resp: 16    BP 151/103 mmHg  Pulse 65  Temp(Src) 98.3 F (36.8 C) (Oral)  Resp 16  SpO2 100%  General Appearance:    Alert, oriented, no distress, appears stated age  Head:    Normocephalic, atraumatic  Eyes:    PERRL, EOMI, sclera non-icteric        Nose:   Nares without drainage or epistaxis. Mucosa, turbinates normal  Throat:   Moist mucous membranes. Oropharynx without erythema or exudate.  Neck:   Supple. No carotid bruits.  No thyromegaly.  No lymphadenopathy.   Back:     No CVA tenderness, no spinal tenderness  Lungs:     Clear to auscultation bilaterally, without wheezes, rhonchi or rales  Chest wall:    No tenderness to palpitation  Heart:    Regular rate and rhythm without murmurs, gallops, rubs  Abdomen:     Soft, non-tender, nondistended, normal bowel sounds, no organomegaly  Genitalia:    deferred  Rectal:    deferred  Extremities:   No clubbing, cyanosis or edema.  Pulses:   2+ and symmetric all extremities  Skin:   Skin color, texture, turgor normal, no rashes or lesions  Lymph nodes:   Cervical, supraclavicular, and axillary nodes normal  Neurologic:   CNII-XII intact. Normal strength, sensation and reflexes      throughout    Labs on Admission:  Basic Metabolic Panel:  Recent Labs Lab 01/27/15 1947  NA 138  K 3.4*   CL 95*  CO2 33*  GLUCOSE 183*  BUN 15  CREATININE 3.98*  CALCIUM 9.4   Liver Function Tests:  Recent Labs Lab 01/27/15 1947  AST 38  ALT 24  ALKPHOS 146*  BILITOT 0.3  PROT 8.1  ALBUMIN 3.7    Recent Labs Lab 01/27/15 1947  LIPASE 80*   No results for input(s): AMMONIA in the last 168 hours. CBC:  Recent Labs Lab 01/27/15 1947  WBC 4.6  HGB 12.8  HCT 39.4  MCV 95.4  PLT 223   Cardiac Enzymes: No results for input(s): CKTOTAL, CKMB, CKMBINDEX, TROPONINI in the last 168 hours.  BNP (last 3 results) No results for input(s): PROBNP in the last 8760 hours. CBG: No results for input(s): GLUCAP in the last 168 hours.  Radiological Exams on Admission: Ct Abdomen Pelvis Wo Contrast  01/28/2015   CLINICAL DATA:  Generalized abdominal pain for 2 weeks. Nausea and vomiting today.  EXAM: CT ABDOMEN AND PELVIS WITHOUT CONTRAST  TECHNIQUE: Multidetector CT imaging of the abdomen and pelvis was performed following the standard protocol without IV contrast.  COMPARISON:  12/17/2014  FINDINGS: There is cholecystectomy. The liver and bile ducts are grossly unremarkable on this unenhanced scan. There are unremarkable unenhanced appearances of the pancreas, spleen, adrenals and kidneys with the exception of a few nonobstructing collecting system calculi. Ureters and urinary bladder appear unremarkable.  The appendix is normal. Remainder of the bowel is unremarkable. The mural thickening and colitic changes observed on 12/17/2014 have resolved. Uterus and ovaries are remarkable only for a small unchanged fibroid on the left. Abdominal aorta is normal in caliber with mild atherosclerotic calcification.  Erosive changes persist about the endplates of L5-S1, and discitis cannot be excluded. Mild spondylolisthesis is present at L4-5 and L5-S1.  IMPRESSION: 1. Resolved colonic mural thickening and inflammation since 12/17/2014. 2. No acute findings are evident in the abdomen or pelvis. 3.  Nephrolithiasis 4. Unchanged irregularity and erosions of the L5-S1 endplates, raising the question of discitis. Lumbar MRI with contrast would be helpful for characterization. 5. Uterine fibroid measuring a little over 1 cm.   Electronically Signed   By: Ellery Plunk M.D.   On: 01/28/2015 00:46    EKG: Independently reviewed.  Assessment/Plan Active Problems:   Nausea vomiting and diarrhea   1. N/V/D - 1. Unclear etiology 2. Supportive care for now with symptom control 3. Patient is already trying POs in the ED and asking about breakfast 4. zofran PRN ordered  5. Also checking an ABG given that her BMP shows an elevated bicarb of 33 and she has no known history of hypercapnea etc (prior BMPs showed normal bicarb). 2. DM2 - will do SSI Q4H low dose, she hasnt taken home insulin in several days due to no PO intake 3. ESRD - if she doesn't go home tomorrow, will need to call nephrology for dialysis on Sat.    Code Status: Full Code  Family Communication: No family in room Disposition Plan: Admit to obs   Time spent: 50 min  GARDNER, JARED M. [facility] Pager [phone]  If 7AM-7PM, please contact the day team taking care of the patient [email or web address] Password [email or web address] 01/28/2015, 2:36 AM

## 2015-01-28 NOTE — ED Notes (Signed)
Pt given applesauce and graham crackers 

## 2015-01-28 NOTE — Care Management Note (Signed)
Case Management Note  Patient Details  Name: Christina [name] MRN: [id number] Date of Birth: 12/04/1940  Subjective/Objective:   Date:01/28/15 Spoke with patient at the bedside stated it is ok to talk to [facility] who is the POA [phone]. Introduced self as Sports coach and explained role in discharge planning and how to be reached. Verified patient lives in town, with sons and daughter n Social worker, has DME  wheelchair. Expressed no potential need for no other DME. Verified patient anticipates to go home with family vs  SNF at time of discharge and will have full-time supervision by family   at this time to best of their knowledge. Patient  denied needing help with their medication. Patient  is driven by [name] to MD appointments. Verified patient has PCP [facility]. Patient states she would like to go to a facility because she does not want to infect [name].  NCM spoke with [name] and she is saying they take care of patient at home, NCM explained patient is 2 plus max ast at this time, [name] stated they are used to doing this at home.  She will be here tomorrow to speak with CSW.  Patient is legally blind and will need help with feedings.  Patient does not ambulate, she is w/chair bound.  Pt eval rec SNF.   Plan: CM will continue to follow for discharge planning and [facility] resources.                  Action/Plan:   Expected Discharge Date:                  Expected Discharge Plan:  Skilled Nursing Facility  In-House Referral:  Clinical Social Work  Discharge planning Services  CM Consult  Post Acute Care Choice:    Choice offered to:     DME Arranged:    DME Agency:     HH Arranged:    HH Agency:     Status of Service:  In process, will continue to follow  Medicare Important Message Given:    Date Medicare IM Given:    Medicare IM give by:    Date Additional Medicare IM Given:    Additional Medicare Important Message give by:     If discussed at Long Length of Stay Meetings,  dates discussed:    Additional Comments:  Leone Haven, RN 01/28/2015, 5:22 PM

## 2015-01-28 NOTE — ED Notes (Signed)
Dr. Gardner at bedside 

## 2015-01-28 NOTE — ED Notes (Signed)
Pt refused arterial blood gas 

## 2015-01-28 NOTE — Consult Note (Signed)
Reason for Consult:ESRD Referring Physician: Dr. Derwood Kaplan is an 74 y.o. female.  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  Apparently 6 BMs day prior. She cannot tell me about episodes N, V, .  D apparently resolved with po Vanco.  Hx of C diff about 6 wk ago. No recent AB. Review of systems not obtained due to patient factors.  Dialyzes at [facility] on TTS since 58yr Primary Nephrologist Christina [name] EDW 63.5 kg. HD Bath 3 k , 2.25 Ca, Dialyzer 180 1900 units, Heparin. Access RUA AVF.  Past Medical History  Diagnosis Date  . Hyperlipidemia   . Hypertension   . Chronic kidney disease   . Glaucoma   . Arthritis   [place]   . Blind   . Diabetes mellitus ([facility]     Past Surgical History  Procedure Laterality Date  . Cholecystectomy      Family History  Problem Relation Age of Onset  . Hypertension Mother   . Hypertension Father     Social History:  reports that she has quit smoking. She does not have any smokeless tobacco history on file. She reports that she does not drink alcohol or use illicit drugs.  Allergies:  Allergies  Allergen Reactions  . Cheese Diarrhea    Pt states due to being on dialysis.  .Christina [name] KitchenPenicillins Hives and Itching    faintng  . Tomato     Pt states due to being on dialysis.    Medications:  I have reviewed the patient's current medications. Prior to Admission:  Prescriptions prior to admission  Medication Sig Dispense Refill Last Dose  . amLODipine (NORVASC) 10 MG tablet Take 10 mg by mouth 2 (two) times daily.    01/27/2015 at Unknown time  . aspirin EC 325 MG EC tablet Take 1 tablet (325 mg total) by mouth daily. 30 tablet 4 01/27/2015 at Unknown time  . atorvastatin (LIPITOR) 20 MG tablet Take 20 mg by mouth at bedtime.    01/26/2015  . ibuprofen (ADVIL,MOTRIN) 600 MG tablet Take 600 mg by mouth every 8 (eight) hours as needed for headache, mild pain or  moderate pain.   1 Past Week at Unknown time  . insulin detemir (LEVEMIR) 100 UNIT/ML injection Inject 10 Units into the skin daily at 12 noon.    01/27/2015 at Unknown time  . metoprolol (LOPRESSOR) 100 MG tablet Take 100 mg by mouth 2 (two) times daily.  0 01/27/2015 at 800  . Multiple Vitamin (MULTIVITAMIN WITH MINERALS) TABS tablet Take 1 tablet by mouth daily.   01/27/2015 at Unknown time  . insulin aspart (NOVOLOG) 100 UNIT/ML injection Sliding scale CBG 70 - 120: 0 units CBG 121 - 150: 1 unit,  CBG 151 - 200: 2 units,  CBG 201 - 250: 3 units,  CBG 251 - 300: 5 units,  CBG 301 - 350: 7 units,  CBG 351 - 400: 9 units   CBG > 400: 9 units and notify your MD (Patient not taking: Reported on 01/28/2015) 10 mL 11 Not Taking at Unknown time   Hectorol 689m iv q HD,.  Venofer 5073mv q wk  Results for orders placed or performed during the hospital encounter of 01/27/15 (from the past 48 hour(s))  Lipase, blood     Status: Abnormal   Collection Time: 01/27/15  7:47 PM  Result Value Ref Range   Lipase  80 (H) 22 - 51 U/L  Comprehensive metabolic panel     Status: Abnormal   Collection Time: 01/27/15  7:47 PM  Result Value Ref Range   Sodium 138 135 - 145 mmol/L   Potassium 3.4 (L) 3.5 - 5.1 mmol/L   Chloride 95 (L) 101 - 111 mmol/L   CO2 33 (H) 22 - 32 mmol/L   Glucose, Bld 183 (H) 65 - 99 mg/dL   BUN 15 6 - 20 mg/dL   Creatinine, Ser 3.98 (H) 0.44 - 1.00 mg/dL   Calcium 9.4 8.9 - 10.3 mg/dL   Total Protein 8.1 6.5 - 8.1 g/dL   Albumin 3.7 3.5 - 5.0 g/dL   AST 38 15 - 41 U/L   ALT 24 14 - 54 U/L   Alkaline Phosphatase 146 (H) 38 - 126 U/L   Total Bilirubin 0.3 0.3 - 1.2 mg/dL   GFR calc non Af Amer 10 (L) >60 mL/min   GFR calc Af Amer 12 (L) >60 mL/min    Comment: (NOTE) The eGFR has been calculated using the CKD EPI equation. This calculation has not been validated in all clinical situations. eGFR's persistently <60 mL/min signify possible Chronic Kidney Disease.    Anion gap 10 5 - 15   CBC     Status: None   Collection Time: 01/27/15  7:47 PM  Result Value Ref Range   WBC 4.6 4.0 - 10.5 K/uL   RBC 4.13 3.87 - 5.11 MIL/uL   Hemoglobin 12.8 12.0 - 15.0 g/dL   HCT 39.4 36.0 - 46.0 %   MCV 95.4 78.0 - 100.0 fL   MCH 31.0 26.0 - 34.0 pg   MCHC 32.5 30.0 - 36.0 g/dL   RDW 14.0 11.5 - 15.5 %   Platelets 223 150 - 400 K/uL  Urinalysis, Routine w reflex microscopic (not at [facility])     Status: Abnormal   Collection Time: 01/27/15 10:07 PM  Result Value Ref Range   Color, Urine YELLOW YELLOW   APPearance CLOUDY (A) CLEAR   Specific Gravity, Urine 1.016 1.005 - 1.030   pH 8.0 5.0 - 8.0   Glucose, UA NEGATIVE NEGATIVE mg/dL   Hgb urine dipstick NEGATIVE NEGATIVE   Bilirubin Urine NEGATIVE NEGATIVE   Ketones, ur NEGATIVE NEGATIVE mg/dL   Protein, ur >300 (A) NEGATIVE mg/dL   Urobilinogen, UA 0.2 0.0 - 1.0 mg/dL   Nitrite NEGATIVE NEGATIVE   Leukocytes, UA NEGATIVE NEGATIVE  Urine microscopic-add on     Status: None   Collection Time: 01/27/15 10:07 PM  Result Value Ref Range   Squamous Epithelial / LPF RARE RARE   WBC, UA 3-6 <3 WBC/hpf   RBC / HPF 0-2 <3 RBC/hpf   Bacteria, UA RARE RARE  I-Stat Troponin, ED (not at [facility])     Status: None   Collection Time: 01/27/15 10:22 PM  Result Value Ref Range   Troponin i, poc 0.01 0.00 - 0.08 ng/mL   Comment 3            Comment: Due to the release kinetics of cTnI, a negative result within the first hours of the onset of symptoms does not rule out myocardial infarction with certainty. If myocardial infarction is still suspected, repeat the test at appropriate intervals.   I-Stat CG4 Lactic Acid, ED     Status: Abnormal   Collection Time: 01/27/15 10:23 PM  Result Value Ref Range   Lactic Acid, Venous 2.84 (HH) 0.5 - 2.0 mmol/L   Comment  NOTIFIED PHYSICIAN   Glucose, capillary     Status: Abnormal   Collection Time: 01/28/15  3:45 AM  Result Value Ref Range   Glucose-Capillary 187 (H) 65 - 99 mg/dL  Basic metabolic  panel     Status: Abnormal   Collection Time: 01/28/15  5:20 AM  Result Value Ref Range   Sodium 136 135 - 145 mmol/L   Potassium 3.7 3.5 - 5.1 mmol/L   Chloride 94 (L) 101 - 111 mmol/L   CO2 30 22 - 32 mmol/L   Glucose, Bld 137 (H) 65 - 99 mg/dL   BUN 21 (H) 6 - 20 mg/dL   Creatinine, Ser 4.65 (H) 0.44 - 1.00 mg/dL   Calcium 9.1 8.9 - 10.3 mg/dL   GFR calc non Af Amer 9 (L) >60 mL/min   GFR calc Af Amer 10 (L) >60 mL/min    Comment: (NOTE) The eGFR has been calculated using the CKD EPI equation. This calculation has not been validated in all clinical situations. eGFR's persistently <60 mL/min signify possible Chronic Kidney Disease.    Anion gap 12 5 - 15  Lipase, blood     Status: Abnormal   Collection Time: 01/28/15  5:20 AM  Result Value Ref Range   Lipase 94 (H) 22 - 51 U/L  Glucose, capillary     Status: None   Collection Time: 01/28/15  8:05 AM  Result Value Ref Range   Glucose-Capillary 67 65 - 99 mg/dL  Glucose, capillary     Status: None   Collection Time: 01/28/15  9:12 AM  Result Value Ref Range   Glucose-Capillary 97 65 - 99 mg/dL  C difficile quick scan w PCR reflex     Status: Abnormal   Collection Time: 01/28/15 10:26 AM  Result Value Ref Range   C Diff antigen POSITIVE (A) NEGATIVE   C Diff toxin NEGATIVE NEGATIVE   C Diff interpretation      C. difficile present, but toxin not detected. This indicates colonization. In most cases, this does not require treatment. If patient has signs and symptoms consistent with colitis, consider treatment. Requires ENTERIC precautions.  Glucose, capillary     Status: Abnormal   Collection Time: 01/28/15 11:51 AM  Result Value Ref Range   Glucose-Capillary 126 (H) 65 - 99 mg/dL  Glucose, capillary     Status: Abnormal   Collection Time: 01/28/15  5:01 PM  Result Value Ref Range   Glucose-Capillary 134 (H) 65 - 99 mg/dL  Glucose, capillary     Status: None   Collection Time: 01/28/15  8:21 PM  Result Value Ref Range    Glucose-Capillary 84 65 - 99 mg/dL    Ct Abdomen Pelvis Wo Contrast  01/28/2015   CLINICAL DATA:  Generalized abdominal pain for 2 weeks. Nausea and vomiting today.  EXAM: CT ABDOMEN AND PELVIS WITHOUT CONTRAST  TECHNIQUE: Multidetector CT imaging of the abdomen and pelvis was performed following the standard protocol without IV contrast.  COMPARISON:  12/17/2014  FINDINGS: There is cholecystectomy. The liver and bile ducts are grossly unremarkable on this unenhanced scan. There are unremarkable unenhanced appearances of the pancreas, spleen, adrenals and kidneys with the exception of a few nonobstructing collecting system calculi. Ureters and urinary bladder appear unremarkable.  The appendix is normal. Remainder of the bowel is unremarkable. The mural thickening and colitic changes observed on 12/17/2014 have resolved. Uterus and ovaries are remarkable only for a small unchanged fibroid on the left. Abdominal aorta is normal in  caliber with mild atherosclerotic calcification.  Erosive changes persist about the endplates of H4-U0, and discitis cannot be excluded. Mild spondylolisthesis is present at L4-5 and L5-S1.  IMPRESSION: 1. Resolved colonic mural thickening and inflammation since 12/17/2014. 2. No acute findings are evident in the abdomen or pelvis. 3. Nephrolithiasis 4. Unchanged irregularity and erosions of the L5-S1 endplates, raising the question of discitis. Lumbar MRI with contrast would be helpful for characterization. 5. Uterine fibroid measuring a little over 1 cm.   Electronically Signed   By: Andreas Newport M.D.   On: 01/28/2015 00:46    ROS Blood pressure 158/72, pulse 66, temperature 98.8 F (37.1 C), temperature source Oral, resp. rate 16, height 5' 7"  (1.702 m), weight 64.184 kg (141 lb 8 oz), SpO2 100 %. Physical Exam Physical Examination: General appearance - alert, well appearing, and in no distress and flight of ideas, difficult to follow,  Mental status - as above Eyes -  shrunken,globes Mouth - mucous membranes moist, pharynx normal without lesions Neck - adenopathy noted PCL Lymphatics - posterior cervical nodes Chest - decreased bs, kyphotic Heart - S1 and S2 normal, systolic murmur QN9/9 at apex Abdomen - soft, nontender, nondistended, no masses or organomegaly Extremities - AVF RUA Skin - thin,no rash  Assessment/Plan: 1 C diff getting better, on po Vanc 2 ESRD: for HD 3 Hypertension: needs vol off, meds 4. Anemia of ESRD: Hb ok no ESA 5. Metabolic Bone Disease: on vit D 6DM per primary 7 Noncompliance one of primary issues 8 Debillitation 9 Blind P HD, Vit D, po vanco.  Pier Bosher L 01/28/2015, 8:35 PM

## 2015-01-29 DIAGNOSIS — R112 Nausea with vomiting, unspecified: Secondary | ICD-10-CM | POA: Diagnosis not present

## 2015-01-29 DIAGNOSIS — R627 Adult failure to thrive: Secondary | ICD-10-CM | POA: Diagnosis not present

## 2015-01-29 DIAGNOSIS — A047 Enterocolitis due to Clostridium difficile: Secondary | ICD-10-CM | POA: Diagnosis not present

## 2015-01-29 DIAGNOSIS — Z9119 Patient's noncompliance with other medical treatment and regimen: Secondary | ICD-10-CM

## 2015-01-29 DIAGNOSIS — Z992 Dependence on renal dialysis: Secondary | ICD-10-CM

## 2015-01-29 DIAGNOSIS — H548 Legal blindness, as defined in USA: Secondary | ICD-10-CM

## 2015-01-29 DIAGNOSIS — N186 End stage renal disease: Secondary | ICD-10-CM | POA: Diagnosis not present

## 2015-01-29 LAB — HEMOGLOBIN A1C
Hgb A1c MFr Bld: 5.1 % (ref 4.8–5.6)
Mean Plasma Glucose: 100 mg/dL

## 2015-01-29 LAB — GLUCOSE, CAPILLARY
GLUCOSE-CAPILLARY: 114 mg/dL — AB (ref 65–99)
GLUCOSE-CAPILLARY: 108 mg/dL — AB (ref 65–99)
GLUCOSE-CAPILLARY: 95 mg/dL (ref 65–99)
Glucose-Capillary: 76 mg/dL (ref 65–99)
Glucose-Capillary: 147 mg/dL — ABNORMAL HIGH (ref 65–99)

## 2015-01-29 LAB — CBC
HEMATOCRIT: 34 % — AB (ref 36.0–46.0)
Hemoglobin: 11.4 g/dL — ABNORMAL LOW (ref 12.0–15.0)
MCH: 31.7 pg (ref 26.0–34.0)
MCHC: 33.5 g/dL (ref 30.0–36.0)
MCV: 94.4 fL (ref 78.0–100.0)
PLATELETS: 193 10*3/uL (ref 150–400)
RBC: 3.6 MIL/uL — AB (ref 3.87–5.11)
RDW: 13.8 % (ref 11.5–15.5)
WBC: 4 10*3/uL (ref 4.0–10.5)

## 2015-01-29 LAB — RENAL FUNCTION PANEL
Albumin: 3.2 g/dL — ABNORMAL LOW (ref 3.5–5.0)
Anion gap: 13 (ref 5–15)
BUN: 29 mg/dL — AB (ref 6–20)
CHLORIDE: 92 mmol/L — AB (ref 101–111)
CO2: 27 mmol/L (ref 22–32)
CREATININE: 6.68 mg/dL — AB (ref 0.44–1.00)
Calcium: 9.3 mg/dL (ref 8.9–10.3)
GFR calc Af Amer: 6 mL/min — ABNORMAL LOW (ref >60)
GFR calc non Af Amer: 6 mL/min — ABNORMAL LOW (ref >60)
Glucose, Bld: 147 mg/dL — ABNORMAL HIGH (ref 65–99)
POTASSIUM: 4.1 mmol/L (ref 3.5–5.1)
Phosphorus: 3 mg/dL (ref 2.5–4.6)
Sodium: 132 mmol/L — ABNORMAL LOW (ref 135–145)

## 2015-01-29 LAB — LIPASE, BLOOD: Lipase: 342 U/L — ABNORMAL HIGH (ref 22–51)

## 2015-01-29 LAB — LACTIC ACID, PLASMA: LACTIC ACID, VENOUS: 1.1 mmol/L (ref 0.5–2.0)

## 2015-01-29 MED ORDER — CLONAZEPAM 0.5 MG PO TABS
0.2500 mg | ORAL_TABLET | Freq: Three times a day (TID) | ORAL | Status: DC | PRN
  Administered 2015-01-29: 0.25 mg via ORAL
  Filled 2015-01-29: qty 1

## 2015-01-29 MED ORDER — SODIUM CHLORIDE 0.9 % IV SOLN: 100.0000 mL | INTRAVENOUS | Status: DC | PRN

## 2015-01-29 MED ORDER — ACETAMINOPHEN 325 MG PO TABS
650.0000 mg | ORAL_TABLET | Freq: Four times a day (QID) | ORAL | Status: DC | PRN
  Administered 2015-01-29: 650 mg via ORAL

## 2015-01-29 MED ORDER — HEPARIN SODIUM (PORCINE) 1000 UNIT/ML DIALYSIS
40.0000 [IU]/kg | Freq: Once | INTRAMUSCULAR | Status: AC
  Administered 2015-01-29: 2600 [IU] via INTRAVENOUS_CENTRAL
  Filled 2015-01-29: qty 3

## 2015-01-29 MED ORDER — HEPARIN SODIUM (PORCINE) 1000 UNIT/ML DIALYSIS
1000.0000 [IU] | INTRAMUSCULAR | Status: DC | PRN
  Filled 2015-01-29: qty 1

## 2015-01-29 MED ORDER — PENTAFLUOROPROP-TETRAFLUOROETH EX AERO: 1.0000 "application " | INHALATION_SPRAY | CUTANEOUS | Status: DC | PRN

## 2015-01-29 MED ORDER — ALTEPLASE 2 MG IJ SOLR
2.0000 mg | Freq: Once | INTRAMUSCULAR | Status: DC | PRN
  Filled 2015-01-29: qty 2

## 2015-01-29 MED ORDER — POLYETHYLENE GLYCOL 3350 17 G PO PACK
17.0000 g | PACK | Freq: Every day | ORAL | Status: DC
  Administered 2015-01-29 – 2015-01-30 (×2): 17 g via ORAL
  Filled 2015-01-29 (×2): qty 1

## 2015-01-29 MED ORDER — HYDRALAZINE HCL 20 MG/ML IJ SOLN: 10.0000 mg | Freq: Four times a day (QID) | INTRAMUSCULAR | Status: DC | PRN

## 2015-01-29 MED ORDER — LIDOCAINE-PRILOCAINE 2.5-2.5 % EX CREA
1.0000 "application " | TOPICAL_CREAM | CUTANEOUS | Status: DC | PRN
  Filled 2015-01-29: qty 5

## 2015-01-29 MED ORDER — DOXERCALCIFEROL 4 MCG/2ML IV SOLN
INTRAVENOUS | Status: AC
  Administered 2015-01-29: 6 ug via INTRAVENOUS
  Filled 2015-01-29: qty 4

## 2015-01-29 MED ORDER — LIDOCAINE HCL (PF) 1 % IJ SOLN
5.0000 mL | INTRAMUSCULAR | Status: DC | PRN
  Filled 2015-01-29: qty 5

## 2015-01-29 MED ORDER — ACETAMINOPHEN 325 MG PO TABS
ORAL_TABLET | ORAL | Status: AC
  Administered 2015-01-29: 650 mg via ORAL
  Filled 2015-01-29: qty 2

## 2015-01-29 NOTE — Progress Notes (Signed)
Patient very agitated. Patient insist on terminating her tx. Patient encouraged to complete her prescribed tx. Despite urging from staff, patient at this point is now pulling at her needles in attempt to decannulate her access. Patient has been rinsed back and treatment terminated to prevent injury to patient and staff. Lavena Bullion NP is present for this episode.

## 2015-01-29 NOTE — Progress Notes (Signed)
  [facility] Progress Note   Subjective: no complaints, wants solid food. NO sob or ocugh. dEnies n/v.   Filed Vitals:   01/28/15 0405 01/28/15 1450 01/28/15 2141 01/29/15 0621  BP: 162/68 158/72 146/54 155/71  Pulse: 64 66 65 55  Temp: 98.8 F (37.1 C)  98.2 F (36.8 C) 97.8 F (36.6 C)  TempSrc: Oral  Oral Oral  Resp: Height:      Weight:      SpO2: 100% 100% 100% 100%   Exam: Alert, blind, no distress  No jvd Chest clear bilat RRR soft SEM no rub Abd soft ntnd +bs No LE edema Neuro gen'd weakness, esp LE's RUA AVF +bruit  [facility] TTS   Duration?  63.5kg   3/2.25 bath  Heparin 1900   RUA AVF   Assessment: 1 C diff on po Vanc 2 ESRD HD tts 3 Hypertension: needs vol off, meds 4 Anemia of ESRD: Hb ok no ESA 5 Metabolic Bone Disease: on vit D 6 DM per primary 7 Noncompliance one of primary issues 8 Debilitation WC-dependent 9 Blind  P HD today, po vanc    Vinson Moselle MD  pager [phone]    cell [phone]  01/29/2015, 10:05 AM     Recent Labs Lab 01/27/15 1947 01/28/15 0520  NA 138 136  K 3.4* 3.7  CL 95* 94*  CO2 33* 30  GLUCOSE 183* 137*  BUN 15 21*  CREATININE 3.98* 4.65*  CALCIUM 9.4 9.1    Recent Labs Lab 01/27/15 1947  AST 38  ALT 24  ALKPHOS 146*  BILITOT 0.3  PROT 8.1  ALBUMIN 3.7    Recent Labs Lab 01/27/15 1947  WBC 4.6  HGB 12.8  HCT 39.4  MCV 95.4  PLT 223   . amLODipine  10 mg Oral QHS  . aspirin EC  325 mg Oral Daily  . atorvastatin  20 mg Oral QHS  . doxercalciferol  6 mcg Intravenous Q T,Th,Sa-HD  . heparin  5,000 Units Subcutaneous 3 times per day  . insulin aspart  0-9 Units Subcutaneous 6 times per day  . metoprolol tartrate  100 mg Oral BID  . multivitamin  1 tablet Oral QHS  . vancomycin  125 mg Oral QID     ondansetron (ZOFRAN) IV

## 2015-01-29 NOTE — Progress Notes (Signed)
PROGRESS NOTE  Christina [name] JXB:[id number] DOB: 1940-06-25 DOA: 01/27/2015 PCP: Mia Creek, MD  HPI/Recap of past 24 hours:  No more diarrhea, tolerating diet, noncompliant patient, refused to get full HD treatment, states want to be discharged tomorrow, so she can go to [place].  Assessment/Plan: Active Problems:   Nausea vomiting and diarrhea   Enteritis due to Clostridium difficile   ESRD on dialysis [facility])   Legally blind   FTT (failure to thrive) in adult  Diffuse ab pain, n/v/d,  cdiff antigen positive toxin negative, CT ab showed improvement compare to the one from 11/2014, but due to significant symptom will treat with oral vanc. Patient reported has an colonoscopy scheduled on 10/29. Improving on oral vancomycin  Lactic acidosis: from c diff? normalized  Elevated lipase in ESRD patient, not sure significance, Ct ab did not show pancreatitis, currently patient on clears due to ongoing n/v/d and ab pain.  ESRD on HD TTS, nephrology consulted for HD support.  HTN/HLD: stable, continue home meds.  FTT: per patient reported she is total care at baseline due to legally blindness and generalized weakness, PT/OT  Non complaint with medical treatment, legally blind, total care, prognosis guarded.     Code Status: full  Family Communication: patient   Disposition Plan: pending   Consultants:  nephrology  Procedures:  HD TTS  Antibiotics:  Oral vanc   Objective: BP 203/71 mmHg  Pulse 77  Temp(Src) 98.1 F (36.7 C) (Oral)  Resp 20  Ht  (1.702 m)  Wt 138 lb 0.1 oz (62.6 kg)  BMI 21.61 kg/m2  SpO2 100%  Intake/Output Summary (Last 24 hours) at 01/29/15 1931 Last data filed at 01/29/15 1700  Gross per 24 hour  Intake    118 ml  Output   1298 ml  Net  -1180 ml   Filed Weights   01/28/15 0400 01/29/15 1305 01/29/15 1602  Weight: 141 lb 8 oz (64.184 kg) 144 lb 2.9 oz (65.4 kg) 138 lb 0.1 oz (62.6 kg)    Exam:   General:   NAD  Cardiovascular: RRR  Respiratory: CTABL  Abdomen: less tenderness, less guarding, no rebound, not distended,  positive BS  Musculoskeletal: No Edema, bilateral lower extremity weakness, not able to lift against gravity  Neuro: aaox3, legally blind.  Data Reviewed: Basic Metabolic Panel:  Recent Labs Lab 01/27/15 1947 01/28/15 0520 01/29/15 1333  NA 138 136 132*  K 3.4* 3.7 4.1  CL 95* 94* 92*  CO2 33* 30 27  GLUCOSE 183* 137* 147*  BUN 15 21* 29*  CREATININE 3.98* 4.65* 6.68*  CALCIUM 9.4 9.1 9.3  PHOS  --   --  3.0   Liver Function Tests:  Recent Labs Lab 01/27/15 1947 01/29/15 1333  AST 38  --   ALT 24  --   ALKPHOS 146*  --   BILITOT 0.3  --   PROT 8.1  --   ALBUMIN 3.7 3.2*    Recent Labs Lab 01/27/15 1947 01/28/15 0520 01/29/15 0453  LIPASE 80* 94* 342*   No results for input(s): AMMONIA in the last 168 hours. CBC:  Recent Labs Lab 01/27/15 1947 01/29/15 1334  WBC 4.6 4.0  HGB 12.8 11.4*  HCT 39.4 34.0*  MCV 95.4 94.4  PLT 223 193   Cardiac Enzymes:   No results for input(s): CKTOTAL, CKMB, CKMBINDEX, TROPONINI in the last 168 hours. BNP (last 3 results) No results for input(s): BNP in the last 8760 hours.  ProBNP (last 3 results) No results for input(s): PROBNP in the last 8760 hours.  CBG:  Recent Labs Lab 01/28/15 2354 01/29/15 0428 01/29/15 0752 01/29/15 1206 01/29/15 1709  GLUCAP 106* 114* 108* 95 76    Recent Results (from the past 240 hour(s))  C difficile quick scan w PCR reflex     Status: Abnormal   Collection Time: 01/28/15 10:26 AM  Result Value Ref Range Status   C Diff antigen POSITIVE (A) NEGATIVE Final   C Diff toxin NEGATIVE NEGATIVE Final   C Diff interpretation   Final    C. difficile present, but toxin not detected. This indicates colonization. In most cases, this does not require treatment. If patient has signs and symptoms consistent with colitis, consider treatment. Requires ENTERIC  precautions.     Studies: No results found.  Scheduled Meds: . amLODipine  10 mg Oral QHS  . aspirin EC  325 mg Oral Daily  . atorvastatin  20 mg Oral QHS  . doxercalciferol  6 mcg Intravenous Q T,Th,Sa-HD  . heparin  5,000 Units Subcutaneous 3 times per day  . insulin aspart  0-9 Units Subcutaneous 6 times per day  . metoprolol tartrate  100 mg Oral BID  . multivitamin  1 tablet Oral QHS  . vancomycin  125 mg Oral QID    Continuous Infusions:    Time spent:   Larwence Tu MD, PhD  [facility] Pager [phone]. If 7PM-7AM, please contact night-coverage at [email or web address], password [facility] 01/29/2015, 7:31 PM

## 2015-01-30 DIAGNOSIS — N186 End stage renal disease: Secondary | ICD-10-CM | POA: Diagnosis not present

## 2015-01-30 DIAGNOSIS — A047 Enterocolitis due to Clostridium difficile: Secondary | ICD-10-CM | POA: Diagnosis not present

## 2015-01-30 DIAGNOSIS — R627 Adult failure to thrive: Secondary | ICD-10-CM | POA: Diagnosis not present

## 2015-01-30 DIAGNOSIS — R112 Nausea with vomiting, unspecified: Secondary | ICD-10-CM | POA: Diagnosis not present

## 2015-01-30 LAB — GLUCOSE, CAPILLARY
GLUCOSE-CAPILLARY: 167 mg/dL — AB (ref 65–99)
GLUCOSE-CAPILLARY: 83 mg/dL (ref 65–99)
GLUCOSE-CAPILLARY: 95 mg/dL (ref 65–99)
Glucose-Capillary: 60 mg/dL — ABNORMAL LOW (ref 65–99)
Glucose-Capillary: 69 mg/dL (ref 65–99)

## 2015-01-30 MED ORDER — VANCOMYCIN HCL 125 MG PO CAPS: 125.0000 mg | ORAL_CAPSULE | Freq: Four times a day (QID) | ORAL | Status: AC

## 2015-01-30 MED ORDER — POLYETHYLENE GLYCOL 3350 17 G PO PACK: 17.0000 g | PACK | Freq: Every day | ORAL | Status: AC | PRN

## 2015-01-30 MED ORDER — VANCOMYCIN HCL 125 MG PO CAPS: 125.0000 mg | ORAL_CAPSULE | Freq: Two times a day (BID) | ORAL | Status: AC

## 2015-01-30 MED ORDER — VANCOMYCIN HCL 125 MG PO CAPS: 125.0000 mg | ORAL_CAPSULE | Freq: Every day | ORAL | Status: AC

## 2015-01-30 NOTE — Discharge Summary (Signed)
Discharge Summary  Christina [name] TIW:[id number] DOB: 1940-10-05  PCP: Mia Creek, MD  Admit date: 01/27/2015 Discharge date: 01/30/2015  Time spent: <32mins  Recommendations for Outpatient Follow-up:  1. F/u with PMD within a week for hospital discharge follow up 2. [name] is to keep prior scheduled GI follow up  Discharge Diagnoses:  Active Hospital Problems   Diagnosis Date Noted  . Nausea vomiting and diarrhea 01/28/2015  . Enteritis due to Clostridium difficile   . ESRD on dialysis (HCC)   . Legally blind   . FTT (failure to thrive) in adult     Resolved Hospital Problems   Diagnosis Date Noted Date Resolved  No resolved problems to display.    Discharge Condition: stable  Diet recommendation: heart healthy/carb modified/renal diet  Filed Weights   01/28/15 0400 01/29/15 1305 01/29/15 1602  Weight: 141 lb 8 oz (64.184 kg) 144 lb 2.9 oz (65.4 kg) 138 lb 0.1 oz (62.6 kg)    History of present illness:  Dymin [name] is a 74 y.o. female with ESRD dialysis TTS, dialyzed earlier today. [name] presents to ED with c/o N/V/D and abdominal pain. Symptoms have been worsening for the past week. Pain is located all over her abdomen, constant, worsening. No fever. Pain is severe, nothing makes it better or worse. States she was sent in from dialysis center.  Hospital Course:  Active Problems:   Nausea vomiting and diarrhea   Enteritis due to Clostridium difficile   ESRD on dialysis [facility])   Legally blind   FTT (failure to thrive) in adult  Diffuse ab pain, n/v/d,  cdiff antigen positive toxin negative, CT ab showed improvement compare to the one from 11/2014, but due to significant symptom will treat with oral vanc. [name] reported was on oral vancomycin for a week in August, 2016, she has an colonoscopy scheduled on 10/29. Symptom resolved on oral vancomycin, discharge with longer course of oral vancomycin taper. And pmd /GI outpatient follow up.  Lactic  acidosis: from c diff? normalized  Elevated lipase in ESRD [name], not sure significance, Ct ab did not show pancreatitis, [name] tolerated diet, no ab pain at discharge.   ESRD on HD TTS, nephrology consulted for HD support.  HTN/HLD: stable, continue home meds.  FTT: per [name] reported she is total care at baseline due to legally blindness and generalized weakness, PT/OT  Non complaint with medical treatment, legally blind, total care, prognosis guarded.     Code Status: full  Family Communication: [name]   Disposition Plan: home to family   Consultants:  nephrology  Procedures:  HD TTS  Antibiotics:  Oral vanc   Discharge Exam: BP 145/69 mmHg  Pulse 61  Temp(Src) 97.8 F (36.6 C) (Oral)  Resp 16  Ht 5\' 7"  (1.702 m)  Wt 138 lb 0.1 oz (62.6 kg)  BMI 21.61 kg/m2  SpO2 100%   General: NAD  Cardiovascular: RRR  Respiratory: CTABL  Abdomen:  Tenderness resolved, no guarding, no rebound, not distended, positive BS  Musculoskeletal: No Edema, bilateral lower extremity weakness, not able to lift against gravity  Neuro: aaox3, legally blind., intermittent agitation, difficult personality   Discharge Instructions You were cared for by a hospitalist during your hospital stay. If you have any questions about your discharge medications or the care you received while you were in the hospital after you are Christina, you can call the unit and asked to speak with the hospitalist on call if the hospitalist that took care of you is not  available. Once you are Christina, your primary care physician will handle any further medical issues. Please note that NO REFILLS for any discharge medications will be authorized once you are Christina, as it is imperative that you return to your primary care physician (or establish a relationship with a primary care physician if you do not have one) for your aftercare needs so that they can reassess your need for medications and  monitor your lab values.      Discharge Instructions    Diet - low sodium heart healthy    Complete by:  As directed      Increase activity slowly    Complete by:  As directed             Medication List    TAKE these medications        amLODipine 10 MG tablet  Commonly known as:  NORVASC  Take 10 mg by mouth 2 (two) times daily.     aspirin 325 MG EC tablet  Take 1 tablet (325 mg total) by mouth daily.     atorvastatin 20 MG tablet  Commonly known as:  LIPITOR  Take 20 mg by mouth at bedtime.     ibuprofen 600 MG tablet  Commonly known as:  ADVIL,MOTRIN  Take 600 mg by mouth every 8 (eight) hours as needed for headache, mild pain or moderate pain.     insulin aspart 100 UNIT/ML injection  Commonly known as:  novoLOG  Sliding scale CBG 70 - 120: 0 units CBG 121 - 150: 1 unit,  CBG 151 - 200: 2 units,  CBG 201 - 250: 3 units,  CBG 251 - 300: 5 units,  CBG 301 - 350: 7 units,  CBG 351 - 400: 9 units   CBG > 400: 9 units and notify your MD     insulin detemir 100 UNIT/ML injection  Commonly known as:  LEVEMIR  Inject 10 Units into the skin daily at 12 noon.     metoprolol 100 MG tablet  Commonly known as:  LOPRESSOR  Take 100 mg by mouth 2 (two) times daily.     multivitamin with minerals Tabs tablet  Take 1 tablet by mouth daily.     polyethylene glycol packet  Commonly known as:  MIRALAX / GLYCOLAX  Take 17 g by mouth daily as needed for moderate constipation or severe constipation.     vancomycin 125 MG capsule  Commonly known as:  VANCOCIN  Take 1 capsule (125 mg total) by mouth 4 (four) times daily.     vancomycin 125 MG capsule  Commonly known as:  VANCOCIN  Take 1 capsule (125 mg total) by mouth 2 (two) times daily.  Start taking on:  02/15/2015     vancomycin 125 MG capsule  Commonly known as:  VANCOCIN  Take 1 capsule (125 mg total) by mouth daily.  Start taking on:  02/23/2015       Allergies  Allergen Reactions  . Cheese Diarrhea    Pt  states due to being on dialysis.  [name] Penicillins Hives and Itching    faintng  . Tomato     Pt states due to being on dialysis.   Follow-up Information    Follow up with TALBOT, DAVID C, MD In 1 week.   Specialty:  Internal Medicine   Why:  hospital discharge follow up   Contact information:   [address] [place] [address] [phone]  The results of significant diagnostics from this hospitalization (including imaging, microbiology, ancillary and laboratory) are listed below for reference.    Significant Diagnostic Studies: Ct Abdomen Pelvis Wo Contrast  01/28/2015   CLINICAL DATA:  Generalized abdominal pain for 2 weeks. Nausea and vomiting today.  EXAM: CT ABDOMEN AND PELVIS WITHOUT CONTRAST  TECHNIQUE: Multidetector CT imaging of the abdomen and pelvis was performed following the standard protocol without IV contrast.  COMPARISON:  12/17/2014  FINDINGS: There is cholecystectomy. The liver and bile ducts are grossly unremarkable on this unenhanced scan. There are unremarkable unenhanced appearances of the pancreas, spleen, adrenals and kidneys with the exception of a few nonobstructing collecting system calculi. Ureters and urinary bladder appear unremarkable.  The appendix is normal. Remainder of the bowel is unremarkable. The mural thickening and colitic changes observed on 12/17/2014 have resolved. Uterus and ovaries are remarkable only for a small unchanged fibroid on the left. Abdominal aorta is normal in caliber with mild atherosclerotic calcification.  Erosive changes persist about the endplates of L5-S1, and discitis cannot be excluded. Mild spondylolisthesis is present at L4-5 and L5-S1.  IMPRESSION: 1. Resolved colonic mural thickening and inflammation since 12/17/2014. 2. No acute findings are evident in the abdomen or pelvis. 3. Nephrolithiasis 4. Unchanged irregularity and erosions of the L5-S1 endplates, raising the question of discitis. Lumbar MRI with  contrast would be helpful for characterization. 5. Uterine fibroid measuring a little over 1 cm.   Electronically Signed   By: Ellery Plunk M.D.   On: 01/28/2015 00:46    Microbiology: Recent Results (from the past 240 hour(s))  C difficile quick scan w PCR reflex     Status: Abnormal   Collection Time: 01/28/15 10:26 AM  Result Value Ref Range Status   C Diff antigen POSITIVE (A) NEGATIVE Final   C Diff toxin NEGATIVE NEGATIVE Final   C Diff interpretation   Final    C. difficile present, but toxin not detected. This indicates colonization. In most cases, this does not require treatment. If [name] has signs and symptoms consistent with colitis, consider treatment. Requires ENTERIC precautions.     Labs: Basic Metabolic Panel:  Recent Labs Lab 01/27/15 1947 01/28/15 0520 01/29/15 1333  NA 138 136 132*  K 3.4* 3.7 4.1  CL 95* 94* 92*  CO2 33* 30 27  GLUCOSE 183* 137* 147*  BUN 15 21* 29*  CREATININE 3.98* 4.65* 6.68*  CALCIUM 9.4 9.1 9.3  PHOS  --   --  3.0   Liver Function Tests:  Recent Labs Lab 01/27/15 1947 01/29/15 1333  AST 38  --   ALT 24  --   ALKPHOS 146*  --   BILITOT 0.3  --   PROT 8.1  --   ALBUMIN 3.7 3.2*    Recent Labs Lab 01/27/15 1947 01/28/15 0520 01/29/15 0453  LIPASE 80* 94* 342*   No results for input(s): AMMONIA in the last 168 hours. CBC:  Recent Labs Lab 01/27/15 1947 01/29/15 1334  WBC 4.6 4.0  HGB 12.8 11.4*  HCT 39.4 34.0*  MCV 95.4 94.4  PLT 223 193   Cardiac Enzymes: No results for input(s): CKTOTAL, CKMB, CKMBINDEX, TROPONINI in the last 168 hours. BNP: BNP (last 3 results) No results for input(s): BNP in the last 8760 hours.  ProBNP (last 3 results) No results for input(s): PROBNP in the last 8760 hours.  CBG:  Recent Labs Lab 01/29/15 2004 01/30/15 0006 01/30/15 0410 01/30/15 0805 01/30/15 0837  GLUCAP 147*  95 167* 60* 69       Signed:  Emmabelle Fear MD, PhD  [facility] 01/30/2015,  11:05 AM

## 2015-01-30 NOTE — Progress Notes (Signed)
Patient discharge teaching given, including activity, diet, follow-up appoints, and medications. Patient and daughter verbalized understanding of all discharge instructions. IV access was d/c'd. Vitals are stable. Skin is intact except as charted in most recent assessments. Pt to be escorted out by NT, to be driven home by family. 

## 2015-01-30 NOTE — Progress Notes (Signed)
  [facility] Progress Note   Subjective: eating solid food, no diarrhea  Filed Vitals:   01/29/15 1530 01/29/15 1602 01/29/15 2140 01/30/15 0449  BP: 142/82 203/71 146/81 145/69  Pulse: 62 77 68 61  Temp:  98.1 F (36.7 C) 97.8 F (36.6 C) 97.8 F (36.6 C)  TempSrc:  Oral Oral Oral  Resp:  Height:      Weight:  62.6 kg (138 lb 0.1 oz)    SpO2:  100% 100% 100%   Exam: Alert, blind, no distress  No jvd Chest clear bilat RRR soft SEM no rub Abd soft ntnd +bs No LE edema Neuro gen'd weakness, esp LE's RUA AVF +bruit  [facility] TTS   Duration?  63.5kg   3/2.25 bath  Heparin 1900   RUA AVF   Assessment: 1 C diff on po Vanc. No diarrhea, taking solid food. Wants to go home.  2 ESRD HD tts 3 Hypertension: at dry wt, BP good on norvac/ MTP 4 Anemia of ESRD: Hb ok no ESA 5 Metabolic Bone Disease: on vit D 6 DM per primary 7 Noncompliance one of primary issues 8 Debilitation WC-dependent 9 Blind 10 Dispo - ok for dc from renal standpoint  Plan - per primary   Vinson Moselle MD  pager [phone]    cell [phone]  01/30/2015, 10:44 AM     Recent Labs Lab 01/27/15 1947 01/28/15 0520 01/29/15 1333  NA 138 136 132*  K 3.4* 3.7 4.1  CL 95* 94* 92*  CO2 33* 30 27  GLUCOSE 183* 137* 147*  BUN 15 21* 29*  CREATININE 3.98* 4.65* 6.68*  CALCIUM 9.4 9.1 9.3  PHOS  --   --  3.0    Recent Labs Lab 01/27/15 1947 01/29/15 1333  AST 38  --   ALT 24  --   ALKPHOS 146*  --   BILITOT 0.3  --   PROT 8.1  --   ALBUMIN 3.7 3.2*    Recent Labs Lab 01/27/15 1947 01/29/15 1334  WBC 4.6 4.0  HGB 12.8 11.4*  HCT 39.4 34.0*  MCV 95.4 94.4  PLT 223 193   . amLODipine  10 mg Oral QHS  . aspirin EC  325 mg Oral Daily  . atorvastatin  20 mg Oral QHS  . doxercalciferol  6 mcg Intravenous Q T,Th,Sa-HD  . heparin  5,000 Units Subcutaneous 3 times per day  . insulin aspart  0-9 Units Subcutaneous 6 times per day  . metoprolol tartrate  100 mg Oral  BID  . multivitamin  1 tablet Oral QHS  . polyethylene glycol  17 g Oral Daily  . vancomycin  125 mg Oral QID     sodium chloride, sodium chloride, acetaminophen, alteplase, clonazePAM, heparin, hydrALAZINE, lidocaine (PF), lidocaine-prilocaine, ondansetron (ZOFRAN) IV, pentafluoroprop-tetrafluoroeth

## 2015-02-01 LAB — GI PATHOGEN PANEL BY PCR, STOOL
C difficile toxin A/B: NOT DETECTED
CRYPTOSPORIDIUM BY PCR: NOT DETECTED
Campylobacter by PCR: NOT DETECTED
E COLI (ETEC) LT/ST: NOT DETECTED
E COLI (STEC): NOT DETECTED
E COLI 0157 BY PCR: NOT DETECTED
G LAMBLIA BY PCR: NOT DETECTED
NOROVIRUS G1/G2: NOT DETECTED
Rotavirus A by PCR: NOT DETECTED
Salmonella by PCR: NOT DETECTED
Shigella by PCR: NOT DETECTED

## 2015-04-27 ENCOUNTER — Ambulatory Visit: Payer: Medicare Other | Admitting: Podiatry

## 2015-05-12 ENCOUNTER — Emergency Department (HOSPITAL_COMMUNITY)
Admission: EM | Admit: 2015-05-12 | Discharge: 2015-05-12 | Disposition: A | Discharge status: Home / Self Care | Payer: Medicare Other | Attending: Emergency Medicine | Admitting: Emergency Medicine

## 2015-05-12 ENCOUNTER — Encounter (HOSPITAL_COMMUNITY): Payer: Self-pay | Admitting: Emergency Medicine

## 2015-05-12 DIAGNOSIS — Z7982 Long term (current) use of aspirin: Secondary | ICD-10-CM | POA: Insufficient documentation

## 2015-05-12 DIAGNOSIS — Z992 Dependence on renal dialysis: Secondary | ICD-10-CM | POA: Diagnosis not present

## 2015-05-12 DIAGNOSIS — Z79899 Other long term (current) drug therapy: Secondary | ICD-10-CM | POA: Diagnosis not present

## 2015-05-12 DIAGNOSIS — Z794 Long term (current) use of insulin: Secondary | ICD-10-CM | POA: Insufficient documentation

## 2015-05-12 DIAGNOSIS — M1990 Unspecified osteoarthritis, unspecified site: Secondary | ICD-10-CM | POA: Insufficient documentation

## 2015-05-12 DIAGNOSIS — M79605 Pain in left leg: Secondary | ICD-10-CM | POA: Diagnosis present

## 2015-05-12 DIAGNOSIS — H540 Blindness, both eyes: Secondary | ICD-10-CM | POA: Diagnosis not present

## 2015-05-12 DIAGNOSIS — N186 End stage renal disease: Secondary | ICD-10-CM | POA: Insufficient documentation

## 2015-05-12 DIAGNOSIS — Z87891 Personal history of nicotine dependence: Secondary | ICD-10-CM | POA: Diagnosis not present

## 2015-05-12 DIAGNOSIS — E785 Hyperlipidemia, unspecified: Secondary | ICD-10-CM | POA: Insufficient documentation

## 2015-05-12 DIAGNOSIS — G629 Polyneuropathy, unspecified: Secondary | ICD-10-CM

## 2015-05-12 DIAGNOSIS — Z880 Allergy status to penicillin: Secondary | ICD-10-CM | POA: Insufficient documentation

## 2015-05-12 DIAGNOSIS — I120 Hypertensive chronic kidney disease with stage 5 chronic kidney disease or end stage renal disease: Secondary | ICD-10-CM | POA: Insufficient documentation

## 2015-05-12 DIAGNOSIS — E119 Type 2 diabetes mellitus without complications: Secondary | ICD-10-CM | POA: Diagnosis not present

## 2015-05-12 LAB — CBC WITH DIFFERENTIAL/PLATELET
Basophils Absolute: 0 10*3/uL (ref 0.0–0.1)
Basophils Relative: 0 %
EOS PCT: 2 %
Eosinophils Absolute: 0.1 10*3/uL (ref 0.0–0.7)
HEMATOCRIT: 36.4 % (ref 36.0–46.0)
Hemoglobin: 12.3 g/dL (ref 12.0–15.0)
LYMPHS ABS: 1.9 10*3/uL (ref 0.7–4.0)
LYMPHS PCT: 34 %
MCH: 32.9 pg (ref 26.0–34.0)
MCHC: 33.8 g/dL (ref 30.0–36.0)
MCV: 97.3 fL (ref 78.0–100.0)
MONO ABS: 0.9 10*3/uL (ref 0.1–1.0)
Monocytes Relative: 16 %
Neutro Abs: 2.7 10*3/uL (ref 1.7–7.7)
Neutrophils Relative %: 48 %
PLATELETS: 160 10*3/uL (ref 150–400)
RBC: 3.74 MIL/uL — ABNORMAL LOW (ref 3.87–5.11)
RDW: 13.7 % (ref 11.5–15.5)
WBC: 5.6 10*3/uL (ref 4.0–10.5)

## 2015-05-12 LAB — BASIC METABOLIC PANEL
Anion gap: 13 (ref 5–15)
BUN: 22 mg/dL — AB (ref 6–20)
CHLORIDE: 95 mmol/L — AB (ref 101–111)
CO2: 29 mmol/L (ref 22–32)
Calcium: 9.7 mg/dL (ref 8.9–10.3)
Creatinine, Ser: 4.32 mg/dL — ABNORMAL HIGH (ref 0.44–1.00)
GFR calc Af Amer: 11 mL/min — ABNORMAL LOW (ref >60)
GFR calc non Af Amer: 9 mL/min — ABNORMAL LOW (ref >60)
GLUCOSE: 122 mg/dL — AB (ref 65–99)
POTASSIUM: 3.5 mmol/L (ref 3.5–5.1)
Sodium: 137 mmol/L (ref 135–145)

## 2015-05-12 LAB — CBG MONITORING, ED: Glucose-Capillary: 75 mg/dL (ref 65–99)

## 2015-05-12 MED ORDER — OXYCODONE-ACETAMINOPHEN 5-325 MG PO TABS
1.0000 | ORAL_TABLET | Freq: Once | ORAL | Status: AC
  Administered 2015-05-12: 1 via ORAL
  Filled 2015-05-12: qty 1

## 2015-05-12 MED ORDER — OXYCODONE-ACETAMINOPHEN 5-325 MG PO TABS: 1.0000 | ORAL_TABLET | ORAL | Status: AC | PRN

## 2015-05-12 NOTE — ED Notes (Addendum)
Pt had dialysis today, per EMS they took too much off, now pt c/o pain in arms and legs. Pt supposed to take pain control meds after dialysis and has not taken any today. Nauseous, denies vomiting/diarrhea.  Blind.

## 2015-05-12 NOTE — ED Notes (Signed)
Nurse starting IV 

## 2015-05-12 NOTE — Discharge Instructions (Signed)
Follow-up with your primary care doctor for further recommendations for the neuropathy

## 2015-05-12 NOTE — ED Provider Notes (Signed)
CSN: [id number]     Arrival date & time 05/12/15  1829 History   First MD Initiated Contact with Patient 05/12/15 1915     Chief Complaint  Patient presents with  . Dialysis problem   . Pain     (Consider location/radiation/quality/duration/timing/severity/associated sxs/prior Treatment) HPI.... Patient has end-stage renal disease with dialysis on Tuesday Thursday and Saturday. She did have dialysis today. She is now complaining of pain in her arms and legs. She has been on gabapentin in the past for this condition. She has a primary care relationship in [facility]. No chest pain, dyspnea, fever, sweats, chills.  Past Medical History  Diagnosis Date  . Hyperlipidemia   . Hypertension   . Chronic kidney disease   . Glaucoma   . Arthritis   . Dialysis patient (HCC)   . Blind   . Diabetes mellitus [facility])    Past Surgical History  Procedure Laterality Date  . Cholecystectomy     Family History  Problem Relation Age of Onset  . Hypertension Mother   . Hypertension Father    Social History  Substance Use Topics  . Smoking status: Former Games developer  . Smokeless tobacco: None  . Alcohol Use: No   OB History    No data available     Review of Systems  All other systems reviewed and are negative.     Allergies  Cheese; Penicillins; and Tomato  Home Medications   Prior to Admission medications   Medication Sig Start Date End Date Taking? Authorizing Provider  amLODipine (NORVASC) 10 MG tablet Take 10 mg by mouth 2 (two) times daily.    Yes Historical Provider, MD  aspirin EC 325 MG EC tablet Take 1 tablet (325 mg total) by mouth daily. 05/21/14  Yes Ripudeep Jenna Luo, MD  atorvastatin (LIPITOR) 20 MG tablet Take 20 mg by mouth at bedtime.    Yes Historical Provider, MD  FLUoxetine (PROZAC) 10 MG capsule Take 10 mg by mouth daily. 02/21/15  Yes Historical Provider, MD  gabapentin (NEURONTIN) 100 MG capsule Take 100 mg by mouth daily.   Yes Historical Provider, MD  insulin  detemir (LEVEMIR) 100 UNIT/ML injection Inject 10 Units into the skin daily at 12 noon.    Yes Historical Provider, MD  metoprolol (LOPRESSOR) 100 MG tablet Take 100 mg by mouth daily.  11/08/14  Yes Historical Provider, MD  Multiple Vitamin (MULTIVITAMIN WITH MINERALS) TABS tablet Take 1 tablet by mouth daily.   Yes Historical Provider, MD  insulin aspart (NOVOLOG) 100 UNIT/ML injection Sliding scale CBG 70 - 120: 0 units CBG 121 - 150: 1 unit,  CBG 151 - 200: 2 units,  CBG 201 - 250: 3 units,  CBG 251 - 300: 5 units,  CBG 301 - 350: 7 units,  CBG 351 - 400: 9 units   CBG > 400: 9 units and notify your MD Patient not taking: Reported on 01/28/2015 05/21/14   Ripudeep Jenna Luo, MD  polyethylene glycol (MIRALAX / GLYCOLAX) packet Take 17 g by mouth daily as needed for moderate constipation or severe constipation. Patient not taking: Reported on 05/12/2015 01/30/15   Albertine Grates, MD   BP 162/75 mmHg  Pulse 64  SpO2 99% Physical Exam  Constitutional: She is oriented to person, place, and time.  Blind, talkative  HENT:  Head: Normocephalic and atraumatic.  Neck: Normal range of motion. Neck supple.  Cardiovascular: Normal rate and regular rhythm.   Pulmonary/Chest: Effort normal and breath sounds normal.  Abdominal: Soft. Bowel sounds are normal.  Musculoskeletal: Normal range of motion.  Full range of motion of arms and legs  Neurological: She is alert and oriented to person, place, and time.  Skin: Skin is warm and dry.  Psychiatric: She has a normal mood and affect. Her behavior is normal.  Nursing note and vitals reviewed.   ED Course  Procedures (including critical care time) Labs Review Labs Reviewed  BASIC METABOLIC PANEL - Abnormal; Notable for the following:    Chloride 95 (*)    Glucose, Bld 122 (*)    BUN 22 (*)    Creatinine, Ser 4.32 (*)    GFR calc non Af Amer 9 (*)    GFR calc Af Amer 11 (*)    All other components within normal limits  CBC WITH DIFFERENTIAL/PLATELET -  Abnormal; Notable for the following:    RBC 3.74 (*)    All other components within normal limits  CBG MONITORING, ED    Imaging Review No results found. I have personally reviewed and evaluated these images and lab results as part of my medical decision-making.   EKG Interpretation None      MDM   Final diagnoses:  ESRD on dialysis (HCC)  Neuropathy (HCC)    Patient is hemodynamically stable. I suspect she has had a flareup of her peripheral neuropathy secondary to long-standing dialysis. Basic labs show an elevated creatinine but this is to be expected. I prescribed her a small amount of Percocet.    Donnetta Hutching, MD 05/12/15 2222

## 2015-09-03 ENCOUNTER — Emergency Department (HOSPITAL_COMMUNITY)
Admission: EM | Admit: 2015-09-03 | Discharge: 2015-09-04 | Disposition: A | Discharge status: Home / Self Care | Payer: Medicare Other | Attending: Emergency Medicine | Admitting: Emergency Medicine

## 2015-09-03 ENCOUNTER — Encounter (HOSPITAL_COMMUNITY): Payer: Self-pay | Admitting: Nurse Practitioner

## 2015-09-03 DIAGNOSIS — Z992 Dependence on renal dialysis: Secondary | ICD-10-CM | POA: Insufficient documentation

## 2015-09-03 DIAGNOSIS — Z739 Problem related to life management difficulty, unspecified: Secondary | ICD-10-CM | POA: Diagnosis not present

## 2015-09-03 DIAGNOSIS — Z87891 Personal history of nicotine dependence: Secondary | ICD-10-CM | POA: Diagnosis not present

## 2015-09-03 DIAGNOSIS — Z590 Homelessness: Secondary | ICD-10-CM | POA: Insufficient documentation

## 2015-09-03 DIAGNOSIS — H409 Unspecified glaucoma: Secondary | ICD-10-CM | POA: Insufficient documentation

## 2015-09-03 DIAGNOSIS — H540 Blindness, both eyes: Secondary | ICD-10-CM | POA: Insufficient documentation

## 2015-09-03 DIAGNOSIS — Z880 Allergy status to penicillin: Secondary | ICD-10-CM | POA: Diagnosis not present

## 2015-09-03 DIAGNOSIS — E785 Hyperlipidemia, unspecified: Secondary | ICD-10-CM | POA: Insufficient documentation

## 2015-09-03 DIAGNOSIS — Z638 Other specified problems related to primary support group: Secondary | ICD-10-CM

## 2015-09-03 DIAGNOSIS — M1990 Unspecified osteoarthritis, unspecified site: Secondary | ICD-10-CM | POA: Diagnosis not present

## 2015-09-03 DIAGNOSIS — E1122 Type 2 diabetes mellitus with diabetic chronic kidney disease: Secondary | ICD-10-CM | POA: Diagnosis not present

## 2015-09-03 DIAGNOSIS — N186 End stage renal disease: Secondary | ICD-10-CM | POA: Insufficient documentation

## 2015-09-03 DIAGNOSIS — Z79899 Other long term (current) drug therapy: Secondary | ICD-10-CM | POA: Insufficient documentation

## 2015-09-03 DIAGNOSIS — Z742 Need for assistance at home and no other household member able to render care: Secondary | ICD-10-CM

## 2015-09-03 DIAGNOSIS — Z7982 Long term (current) use of aspirin: Secondary | ICD-10-CM | POA: Insufficient documentation

## 2015-09-03 DIAGNOSIS — Z794 Long term (current) use of insulin: Secondary | ICD-10-CM | POA: Insufficient documentation

## 2015-09-03 DIAGNOSIS — I120 Hypertensive chronic kidney disease with stage 5 chronic kidney disease or end stage renal disease: Secondary | ICD-10-CM | POA: Insufficient documentation

## 2015-09-03 DIAGNOSIS — Z0471 Encounter for examination and observation following alleged adult physical abuse: Secondary | ICD-10-CM | POA: Diagnosis present

## 2015-09-03 NOTE — ED Notes (Signed)
Patient unable to ambulate to BR.  Patient also does not have to strength to use a steady for transport to BR.  Patient used bedpan.

## 2015-09-03 NOTE — ED Notes (Signed)
Pt refuses to change in to hospital gown. Wears a brief at home but reports being continent. Assisted to bathroom using stedy and 2 person assist. Skin assessment completed. Scars noted on lower legs but skin intact. No obvious bruises or wounds.
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
## 2015-09-03 NOTE — ED Notes (Addendum)
Pt states that she wants to be placed in a nursing home in [place]. She is dependent for her ADLs. She requests to go to a shelter via the police as she does not want to stay in the hospital. She says that she will 'crawl out of here' before she will stay in the hospital. Patient reminded that it is the weekend and that social work will likely need to find her placement during the week. She expresses her displeasure with this and requests that we discharge her immediately so that she can go to a shelter.   She also states that if we give any information about her to her son she 'will sue.' Informed that daughter in law is healthcare power of attorney. She says she plans to rescind that.

## 2015-09-03 NOTE — ED Notes (Signed)
[employer] PA notified to see patient if she has time (per [place]). Patient requests that her family not be given any information regarding her presence at this hospital. Patient is observed talking on phone telling friends that she is here and her situation.

## 2015-09-03 NOTE — Discharge Instructions (Signed)
Stress and Stress Management °Stress is a normal reaction to life events. It is what you feel when life demands more than you are used to or more than you can handle. Some stress can be useful. For example, the stress reaction can help you catch the last bus of the day, study for a test, or meet a deadline at work. But stress that occurs too often or for too long can cause problems. It can affect your emotional health and interfere with relationships and normal daily activities. Too much stress can weaken your immune system and increase your risk for physical illness. If you already have a medical problem, stress can make it worse. °CAUSES  °All sorts of life events may cause stress. An event that causes stress for one person may not be stressful for another person. Major life events commonly cause stress. These may be positive or negative. Examples include losing your job, moving into a new home, getting married, having a baby, or losing a loved one. Less obvious life events may also cause stress, especially if they occur day after day or in combination. Examples include working long hours, driving in traffic, caring for children, being in debt, or being in a difficult relationship. °SIGNS AND SYMPTOMS °Stress may cause emotional symptoms including, the following: °· Anxiety. This is feeling worried, afraid, on edge, overwhelmed, or out of control. °· Anger. This is feeling irritated or impatient. °· Depression. This is feeling sad, down, helpless, or guilty. °· Difficulty focusing, remembering, or making decisions. °Stress may cause physical symptoms, including the following:  °· Aches and pains. These may affect your head, neck, back, stomach, or other areas of your body. °· Tight muscles or clenched jaw. °· Low energy or trouble sleeping.  °Stress may cause unhealthy behaviors, including the following:  °· Eating to feel better (overeating) or skipping meals. °· Sleeping too little, too much, or both. °· Working  too much or putting off tasks (procrastination). °· Smoking, drinking alcohol, or using drugs to feel better. °DIAGNOSIS  °Stress is diagnosed through an assessment by your health care provider. Your health care provider will ask questions about your symptoms and any stressful life events. Your health care provider will also ask about your medical history and may order blood tests or other tests. Certain medical conditions and medicine can cause physical symptoms similar to stress.  Mental illness can cause emotional symptoms and unhealthy behaviors similar to stress. Your health care provider may refer you to a mental health professional for further evaluation.  °TREATMENT  °Stress management is the recommended treatment for stress. The goals of stress management are reducing stressful life events and coping with stress in healthy ways.  °Techniques for reducing stressful life events include the following: °· Stress identification. Self-monitor for stress and identify what causes stress for you. These skills may help you to avoid some stressful events. °· Time management. Set your priorities, keep a calendar of events, and learn to say "no." These tools can help you avoid making too many commitments. °Techniques for coping with stress include the following: °· Rethinking the problem. Try to think realistically about stressful events rather than ignoring them or overreacting. Try to find the positives in a stressful situation rather than focusing on the negatives. °· Exercise. Physical exercise can release both physical and emotional tension. The key is to find a form of exercise you enjoy and do it regularly. °· Relaxation techniques. These relax the body and mind. Examples include yoga, meditation, tai chi, biofeedback, deep   breathing, progressive muscle relaxation, listening to music, being out in nature, journaling, and other hobbies. Again, the key is to find one or more that you enjoy and can do  regularly.  Healthy lifestyle. Eat a balanced diet, get plenty of sleep, and do not smoke. Avoid using alcohol or drugs to relax.  Strong support network. Spend time with family, friends, or other people you enjoy being around.Express your feelings and talk things over with someone you trust. Counseling or talktherapy with a mental health professional may be helpful if you are having difficulty managing stress on your own. Medicine is typically not recommended for the treatment of stress.Talk to your health care provider if you think you need medicine for symptoms of stress. HOME CARE INSTRUCTIONS  Keep all follow-up visits as directed by your health care provider.  Take all medicines as directed by your health care provider. SEEK MEDICAL CARE IF:  Your symptoms get worse or you start having new symptoms.  You feel overwhelmed by your problems and can no longer manage them on your own. SEEK IMMEDIATE MEDICAL CARE IF:  You feel like hurting yourself or someone else.   This information is not intended to replace advice given to you by your health care provider. Make sure you discuss any questions you have with your health care provider.   Document Released: 10/03/2000 Document Revised: 04/30/2014 Document Reviewed: 12/02/2012 Elsevier Interactive Patient Education [employer].

## 2015-09-03 NOTE — ED Notes (Signed)
MD at bedside. 

## 2015-09-04 NOTE — ED Notes (Signed)
Spoke with daughter on phone, instructed that pt will be arriving by [facility],

## 2015-09-04 NOTE — ED Notes (Signed)
[employer] in contact with family , waiting for repsonse
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
## 2015-09-04 NOTE — ED Notes (Signed)
Attempted x 3 to call daughter

## 2015-09-04 NOTE — ED Notes (Signed)
Pt awake unable state time and place, HX dementia, [facility] here for transport to home

## 2015-09-04 NOTE — ED Notes (Signed)
[facility] called for transport.  

## 2015-09-04 NOTE — ED Notes (Signed)
Attempted again to call daughter no answer

## 2015-09-04 NOTE — Progress Notes (Signed)
This Clinical research associatewriter was approached by Myrtie HawkPatty, Charge RN to confirm the disposition on this patient.  It was reported that the patient had some concerns with returning home with family on admission.  It was determined and confirmed by Dr. Burr MedicoSchlossman's notation the patient will be discharged home with family.        Maryelizabeth Rowanressa Dacoda Spallone, MSW, Clare CharonLCSW, LCAS [facility] Triage Specialist [phone]

## 2015-12-07 IMAGING — CT CT ABD-PELV W/O CM
2 of 4 series · 17 of 46 positions shown, 19 images · non-contrast
Comparison: 12/17/2014

CLINICAL DATA: Generalized abdominal pain for 2 weeks. Nausea and
vomiting today.

EXAM:
CT ABDOMEN AND PELVIS WITHOUT CONTRAST
TECHNIQUE: Multidetector CT imaging of the abdomen and pelvis was performed
following the standard protocol without IV contrast.

[Series 2: a/p w/o 5mm · axial · non-contrast · 0.79mm/px · z∈[-450,-45]mm · 14 of 89 slices shown, 16 images]
[im 4/89  soft-tissue]
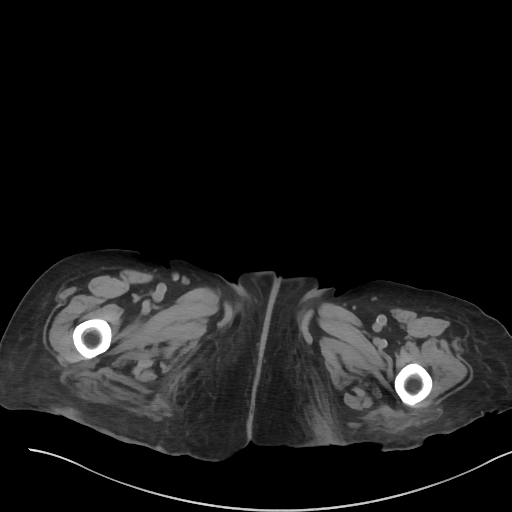
[im 4/89  bone]
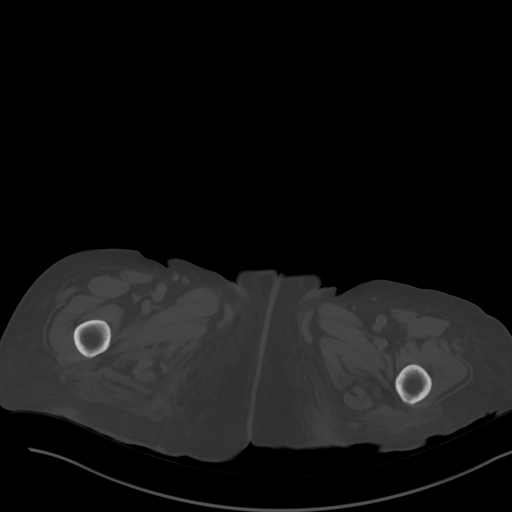
[im 11/89  soft-tissue]
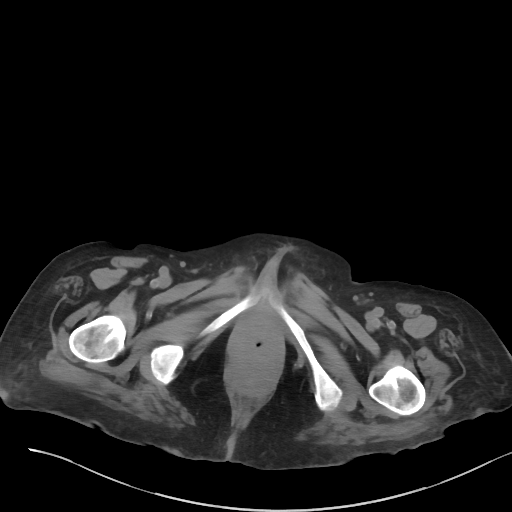
[im 18/89  soft-tissue]
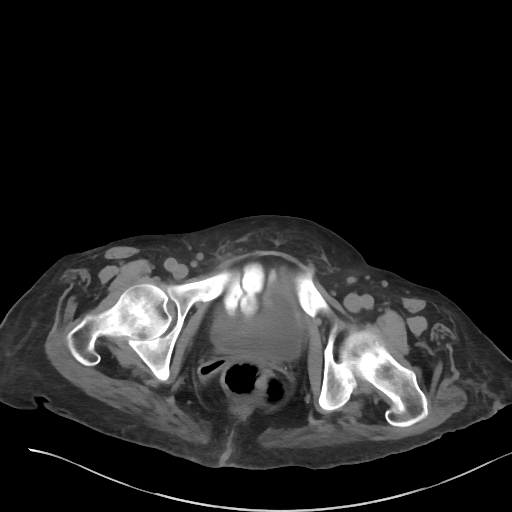
[im 25/89  soft-tissue]
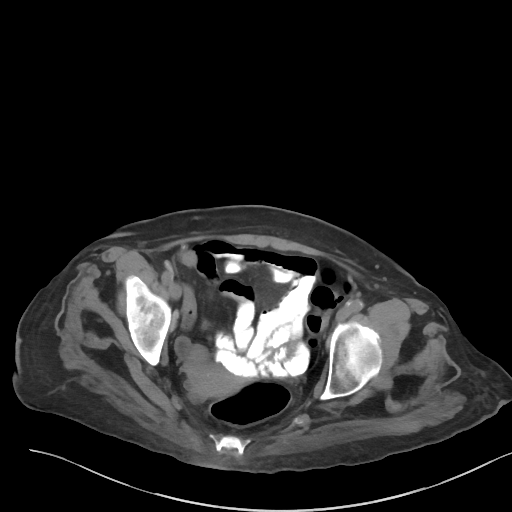
[im 29/89  soft-tissue]
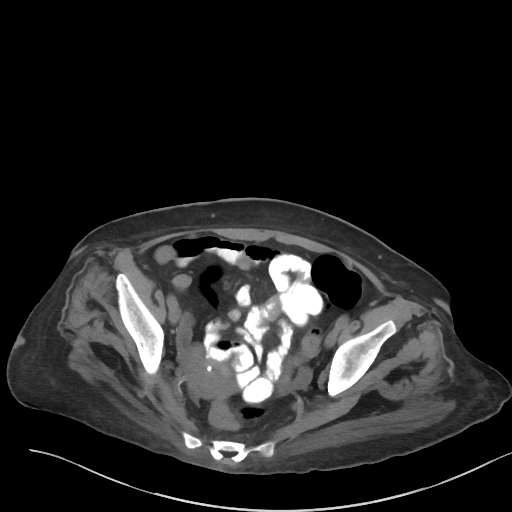
[im 36/89  soft-tissue]
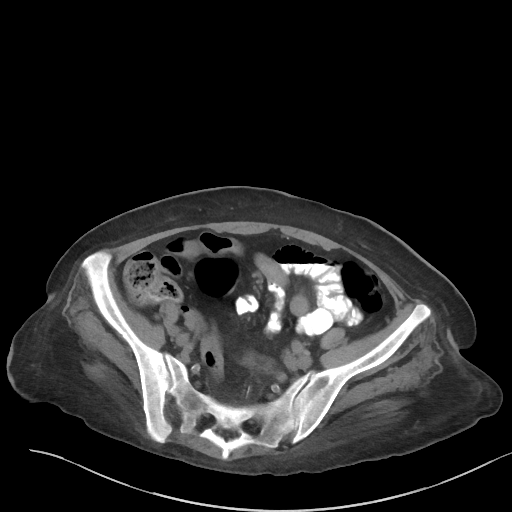
[im 43/89  soft-tissue]
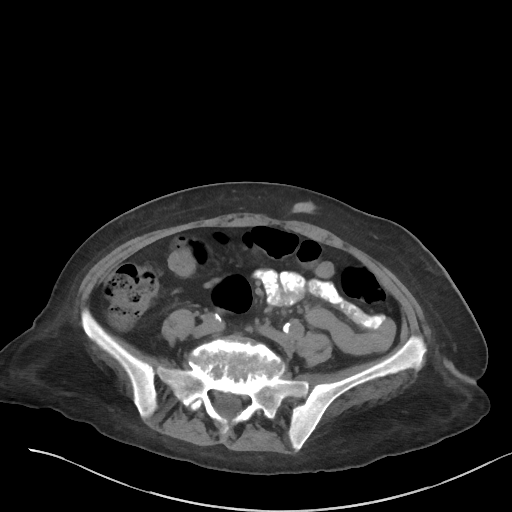
[im 46/89  soft-tissue]
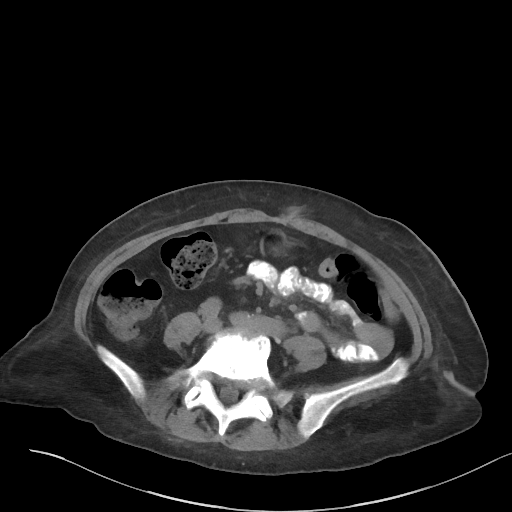
[im 53/89  soft-tissue]
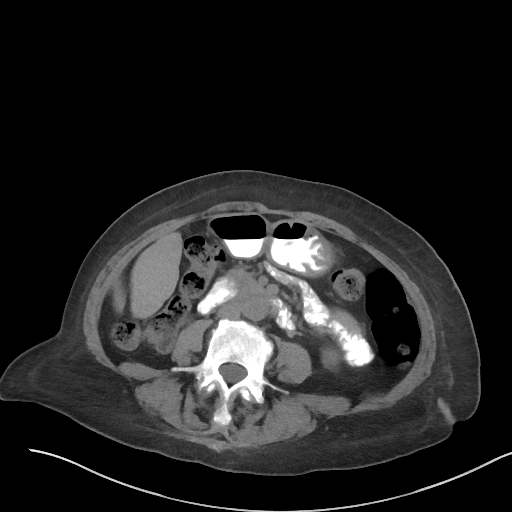
[im 53/89  bone]
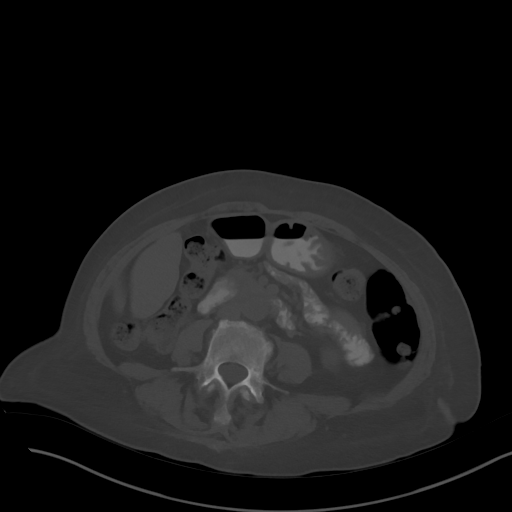
[im 60/89  soft-tissue]
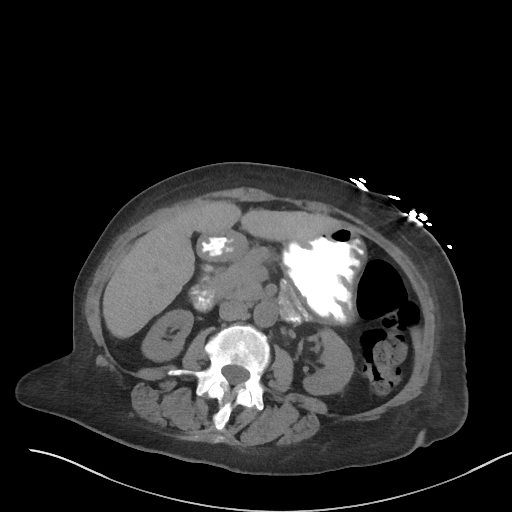
[im 67/89  soft-tissue]
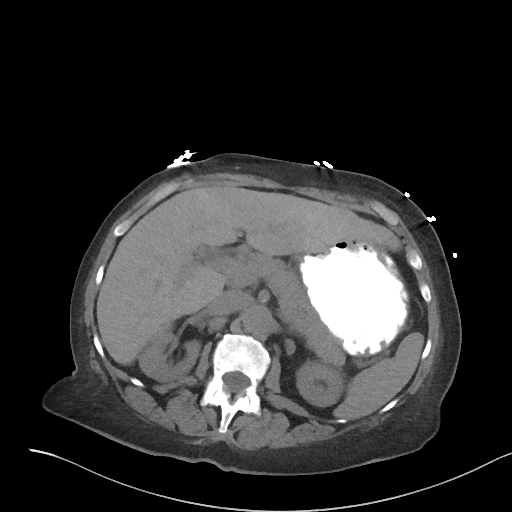
[im 71/89  soft-tissue]
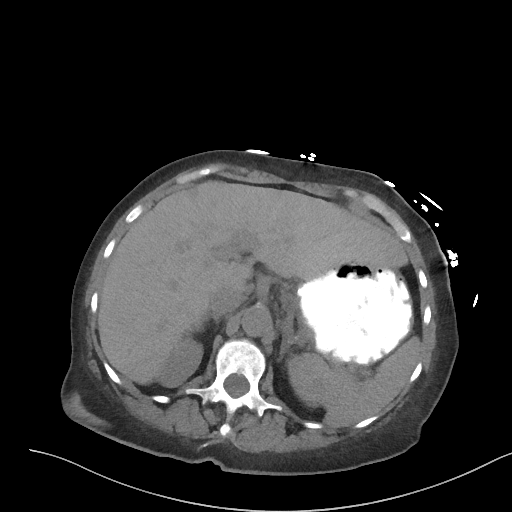
[im 78/89  soft-tissue]
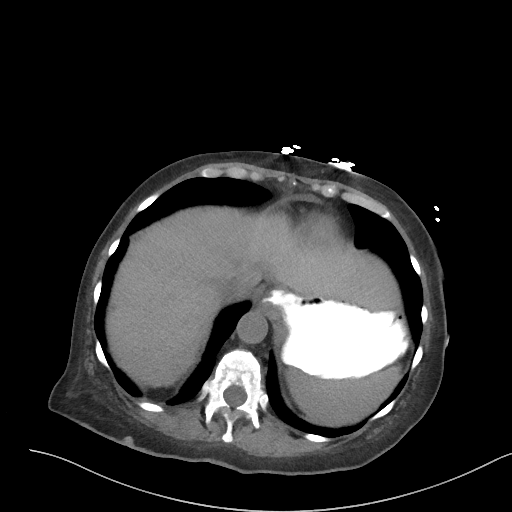
[im 85/89  soft-tissue]
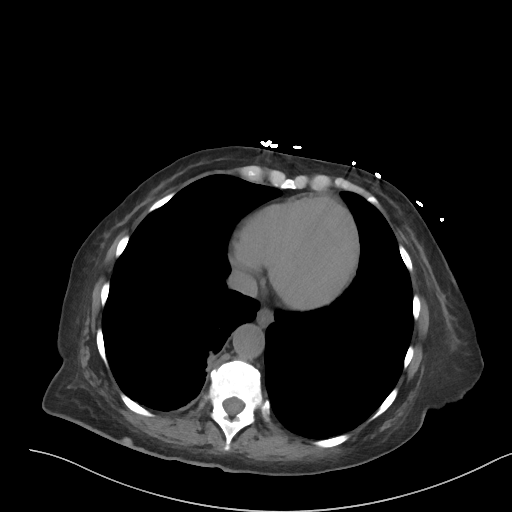

[Series 5: a/p w/o cor · coronal · non-contrast · 0.86mm/px · 3 of 115 slices shown]
[im 39/115  soft-tissue]
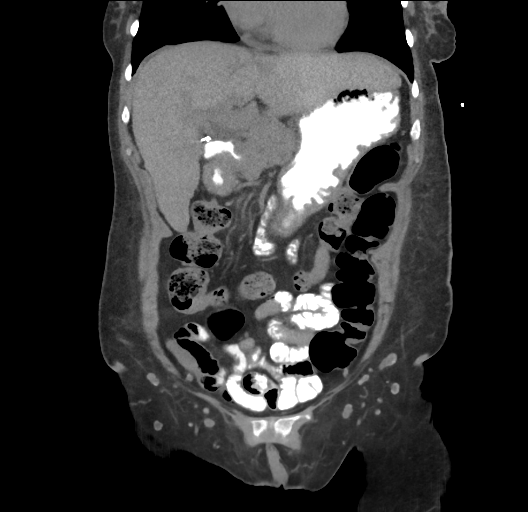
[im 51/115  soft-tissue]
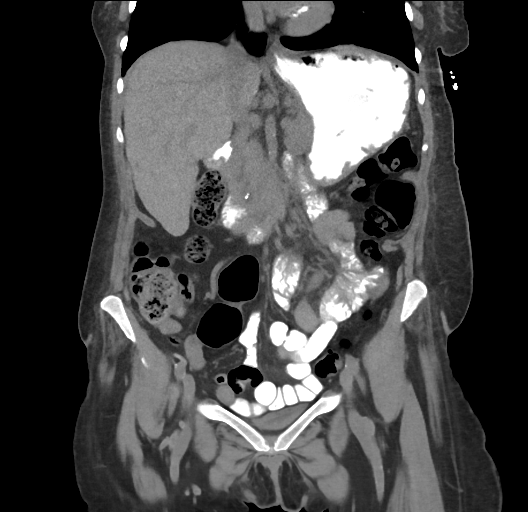
[im 64/115  soft-tissue]
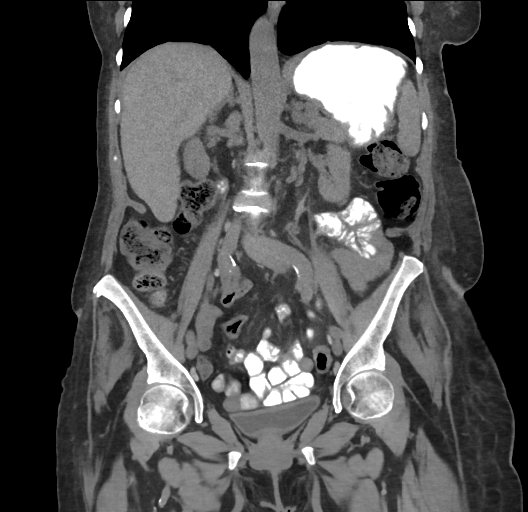

[17 of 46 positions shown; findings below may reference images not displayed]

FINDINGS: There is cholecystectomy. The liver and bile ducts are grossly
unremarkable on this unenhanced scan. There are unremarkable
unenhanced appearances of the pancreas, spleen, adrenals and kidneys
with the exception of a few nonobstructing collecting system
calculi. Ureters and urinary bladder appear unremarkable.

The appendix is normal. Remainder of the bowel is unremarkable. The
mural thickening and colitic changes observed on 12/17/2014 have
resolved. Uterus and ovaries are remarkable only for a small
unchanged fibroid on the left. Abdominal aorta is normal in caliber
with mild atherosclerotic calcification.

Erosive changes persist about the endplates of L5-S1, and discitis
cannot be excluded. Mild spondylolisthesis is present at L4-5 and
L5-S1.
IMPRESSION: 1. Resolved colonic mural thickening and inflammation since
12/17/2014.
[DATE]. No acute findings are evident in the abdomen or pelvis.
3. Nephrolithiasis
4. Unchanged irregularity and erosions of the L5-S1 endplates,
raising the question of discitis. Lumbar MRI with contrast would be
helpful for characterization.
5. Uterine fibroid measuring a little over 1 cm.
